# Patient Record
Sex: Male | Born: 1985 | Hispanic: No | Marital: Single
Health system: Southern US, Community
[De-identification: ages and names within clinical notes are randomized; demographics above are authoritative.]

## PROBLEM LIST (undated history)

## (undated) DIAGNOSIS — G5631 Lesion of radial nerve, right upper limb: Secondary | ICD-10-CM

---

## 2017-07-05 ENCOUNTER — Encounter (HOSPITAL_COMMUNITY): Payer: Self-pay

## 2017-07-05 ENCOUNTER — Ambulatory Visit (HOSPITAL_COMMUNITY)
Admission: EM | Admit: 2017-07-05 | Discharge: 2017-07-05 | Disposition: A | Discharge status: Home / Self Care | Payer: BLUE CROSS/BLUE SHIELD | Attending: Internal Medicine | Admitting: Internal Medicine

## 2017-07-05 DIAGNOSIS — W260XXA Contact with knife, initial encounter: Secondary | ICD-10-CM | POA: Diagnosis not present

## 2017-07-05 DIAGNOSIS — S61412A Laceration without foreign body of left hand, initial encounter: Secondary | ICD-10-CM

## 2017-07-05 DIAGNOSIS — M79642 Pain in left hand: Secondary | ICD-10-CM

## 2017-07-05 DIAGNOSIS — L03114 Cellulitis of left upper limb: Secondary | ICD-10-CM

## 2017-07-05 DIAGNOSIS — Z23 Encounter for immunization: Secondary | ICD-10-CM | POA: Diagnosis not present

## 2017-07-05 MED ORDER — CEFTRIAXONE SODIUM 1 G IJ SOLR
1.0000 g | Freq: Once | INTRAMUSCULAR | Status: AC
  Administered 2017-07-05: 1 g via INTRAMUSCULAR

## 2017-07-05 MED ORDER — TETANUS-DIPHTH-ACELL PERTUSSIS 5-2.5-18.5 LF-MCG/0.5 IM SUSP
0.5000 mL | Freq: Once | INTRAMUSCULAR | Status: AC
  Administered 2017-07-05: 0.5 mL via INTRAMUSCULAR

## 2017-07-05 MED ORDER — MUPIROCIN 2 % EX OINT: 1.0000 "application " | TOPICAL_OINTMENT | Freq: Two times a day (BID) | CUTANEOUS | 0 refills | Status: AC

## 2017-07-05 MED ORDER — SULFAMETHOXAZOLE-TRIMETHOPRIM 800-160 MG PO TABS: 1.0000 | ORAL_TABLET | Freq: Two times a day (BID) | ORAL | 0 refills | Status: AC

## 2017-07-05 MED ORDER — CEPHALEXIN 500 MG PO CAPS: 500.0000 mg | ORAL_CAPSULE | Freq: Two times a day (BID) | ORAL | 0 refills | Status: DC

## 2017-07-05 MED ORDER — TETANUS-DIPHTH-ACELL PERTUSSIS 5-2.5-18.5 LF-MCG/0.5 IM SUSP
INTRAMUSCULAR | Status: AC
  Filled 2017-07-05: qty 0.5

## 2017-07-05 MED ORDER — CEFTRIAXONE SODIUM 1 G IJ SOLR
INTRAMUSCULAR | Status: AC
  Filled 2017-07-05: qty 10

## 2017-07-05 MED ORDER — LIDOCAINE HCL (PF) 2 % IJ SOLN
INTRAMUSCULAR | Status: AC
  Filled 2017-07-05: qty 2

## 2017-07-05 NOTE — ED Provider Notes (Signed)
[facility]    CSN: [id number] Arrival date & time: 07/05/17  1944     History   Chief Complaint Chief Complaint  Patient presents with  . Hand Pain    HPI Edward Wall is a 31 y.o. male. He was cutting a box a couple of days ago, and accidentally cut himself over the left second MCP. It has been sore, but today became suddenly red/swollen/very painful. Seeping a scant amount of serous material.    HPI  History reviewed. No pertinent past medical history.  History reviewed. No pertinent surgical history.     Home Medications    Prior to Admission medications   Medication Sig Start Date End Date Taking? Authorizing Provider  cephALEXin (KEFLEX) 500 MG capsule Take 1 capsule (500 mg total) by mouth 2 (two) times daily. 07/05/17   Eustace Moore, MD  mupirocin ointment (BACTROBAN) 2 % Apply 1 application topically 2 (two) times daily. 07/05/17 07/12/17  Eustace Moore, MD  sulfamethoxazole-trimethoprim (BACTRIM DS,SEPTRA DS) 800-160 MG tablet Take 1 tablet by mouth 2 (two) times daily. 07/05/17 07/15/17  Eustace Moore, MD    Family History History reviewed. No pertinent family history.  Social History Social History  Substance Use Topics  . Smoking status: Current Every Day Smoker  . Smokeless tobacco: Never Used  . Alcohol use Yes     Allergies   Patient has no known allergies.   Review of Systems Review of Systems  All other systems reviewed and are negative.    Physical Exam Triage Vital Signs ED Triage Vitals  Enc Vitals Group     BP 07/05/17 1958 132/88     Pulse Rate 07/05/17 1958 (!) 108     Resp 07/05/17 1958 16     Temp 07/05/17 1958 98.6 F (37 C)     Temp Source 07/05/17 1958 Oral     SpO2 07/05/17 1958 100 %     Weight --      Height --      Pain Score 07/05/17 2000 6     Pain Loc --    Updated Vital Signs BP 132/88 (BP Location: Left Arm)   Pulse (!) 108   Temp 98.6 F (37 C) (Oral)   Resp 16   SpO2 100%    Physical Exam  Constitutional: He is oriented to person, place, and time. No distress.  Alert, nicely groomed  HENT:  Head: Atraumatic.  Eyes:  Conjugate gaze, no eye redness/drainage  Neck: Neck supple.  Cardiovascular: Normal rate.   Pulmonary/Chest: No respiratory distress.  Abdominal: He exhibits no distension.  Musculoskeletal: Normal range of motion.  Neurological: He is alert and oriented to person, place, and time.  Skin: Skin is warm and dry.  No cyanosis Significant redness/swelling around the left second MCP dorsally extending to the back of the hand and slightly to the wrist., with a 1 cm laceration overlying the MCP. He is able to move the finger, but very painful to do so. Not indurated, no focal fluid collection/fluctuance appreciated on exam. Not able to express any drainage. Very tender.  Nursing note and vitals reviewed.    UC Treatments / Results   Procedures Procedures (including critical care time)  Medications Ordered in UC Medications  Tdap (BOOSTRIX) injection 0.5 mL (0.5 mLs Intramuscular Given 07/05/17 2038)  cefTRIAXone (ROCEPHIN) injection 1 g (1 g Intramuscular Given 07/05/17 2038)     Final Clinical Impressions(s) / UC Diagnoses   Final diagnoses:  Cellulitis of left hand   Recheck left hand infection at urgent care tomorrow, 9/16.  Too soon tonight to rule out the possibility of a deeper abscess that would need drainage. Go to ED if marked increase in pain/swelling overnight. Injection of rocephin (antibiotic) and tetanus booster given at the urgent care this evening.  Prescriptions for trimethoprim/sulfa (bactrim, antibiotic for MRSA/staph) and cephalexin (keflex, antibiotic for strep) were sent to the pharmacy.  Start trimethoprim/sulfa tonight and cephalexin in the morning, they are both twice daily antibiotics.  Wash gently with soap/water 1-2 times daily, apply antibiotic ointment and bandage.    New Prescriptions New Prescriptions    CEPHALEXIN (KEFLEX) 500 MG CAPSULE    Take 1 capsule (500 mg total) by mouth 2 (two) times daily.   MUPIROCIN OINTMENT (BACTROBAN) 2 %    Apply 1 application topically 2 (two) times daily.   SULFAMETHOXAZOLE-TRIMETHOPRIM (BACTRIM DS,SEPTRA DS) 800-160 MG TABLET    Take 1 tablet by mouth 2 (two) times daily.     Controlled Substance Prescriptions [place] Controlled Substance Registry consulted? no   Eustace Moore, MD 07/06/17 1255

## 2017-07-05 NOTE — Discharge Instructions (Addendum)
Recheck left hand infection at urgent care tomorrow, 9/16.  Too soon tonight to rule out the possibility of a deeper abscess that would need drainage. Go to ED if marked increase in pain/swelling overnight. Injection of rocephin (antibiotic) and tetanus booster given at the urgent care this evening.  Prescriptions for trimethoprim/sulfa (bactrim, antibiotic for MRSA/staph) and cephalexin (keflex, antibiotic for strep) were sent to the pharmacy.  Start trimethoprim/sulfa tonight and cephalexin in the morning, they are both twice daily antibiotics.  Wash gently with soap/water 1-2 times daily, apply antibiotic ointment and bandage.

## 2017-07-05 NOTE — ED Triage Notes (Signed)
Pt presents today with left hand pain that occurred 2 days ago when he was trying to cut open a box and the knife used cut his hand. States the pain has gotten worse over the last couple day. Has increased swelling and redness.

## 2017-07-06 ENCOUNTER — Encounter (HOSPITAL_COMMUNITY): Payer: Self-pay | Admitting: Family Medicine

## 2017-07-06 ENCOUNTER — Ambulatory Visit (HOSPITAL_COMMUNITY)
Admission: EM | Admit: 2017-07-06 | Discharge: 2017-07-06 | Disposition: A | Discharge status: Home / Self Care | Payer: BLUE CROSS/BLUE SHIELD | Attending: Family Medicine | Admitting: Family Medicine

## 2017-07-06 DIAGNOSIS — L03012 Cellulitis of left finger: Secondary | ICD-10-CM

## 2017-07-06 NOTE — Discharge Instructions (Signed)
Continue your antibiotics. Please return immediately if swelling starts to advance up the arm or if you become feverish. Let's recheck the wound on Wednesday.

## 2017-07-06 NOTE — ED Provider Notes (Signed)
  [facility]   [id number] 07/06/17 Arrival Time: 1949   SUBJECTIVE:  Edward Wall is a 31 y.o. male who presents to the urgent care with complaint of left hand cellulitis that occurred after cutting himself with a hunting knife while opening a box on Thursday night He was seen last night and started on antibiotics.  He was instructed to return today. He worked today.  Swelling and soreness have not changed. No fever.     History reviewed. No pertinent past medical history. History reviewed. No pertinent family history. Social History   Social History  . Marital status: Unknown    Spouse name: N/A  . Number of children: N/A  . Years of education: N/A   Occupational History  . Not on file.   Social History Main Topics  . Smoking status: Current Every Day Smoker  . Smokeless tobacco: Never Used  . Alcohol use Yes  . Drug use: No  . Sexual activity: Not on file   Other Topics Concern  . Not on file   Social History Narrative  . No narrative on file   No outpatient prescriptions have been marked as taking for the 07/06/17 encounter [facility] Encounter).   No Known Allergies    ROS: As per HPI, remainder of ROS negative.   OBJECTIVE:   Vitals:   07/06/17 2002  BP: 112/77  Pulse: 77  Resp: 18  Temp: 98.7 F (37.1 C)  SpO2: 100%     General appearance: alert; no distress Eyes: PERRL; EOMI; conjunctiva normal HENT: normocephalic; atraumatic; TMs normal, canal normal, external ears normal without trauma; nasal mucosa normal; oral mucosa normal Neck: supple Back: no CVA tenderness Extremities: no cyanosis  Skin: warm and dry, with moderate erythema over radial aspect of left hand including the MCP of index finger where the original wound occurred.  No red streaks or swelling over wrist or forearm. Neurologic: normal gait; grossly normal Psychological: alert and cooperative; normal mood and affect       ASSESSMENT & PLAN:  1. Cellulitis  of finger of left hand    Continue current meds  Reviewed expectations re: course of current medical issues. Questions answered. Outlined signs and symptoms indicating need for more acute intervention. Patient verbalized understanding. After Visit Summary given.    Procedures:      Elvina Sidle, MD 07/06/17 2011

## 2017-07-06 NOTE — ED Triage Notes (Signed)
Pt here for wound check to left hand.

## 2019-07-16 ENCOUNTER — Encounter (HOSPITAL_COMMUNITY): Payer: Self-pay

## 2019-07-16 ENCOUNTER — Other Ambulatory Visit: Payer: Self-pay

## 2019-07-16 ENCOUNTER — Inpatient Hospital Stay (HOSPITAL_COMMUNITY): Payer: No Typology Code available for payment source

## 2019-07-16 ENCOUNTER — Emergency Department (HOSPITAL_COMMUNITY): Payer: No Typology Code available for payment source

## 2019-07-16 ENCOUNTER — Inpatient Hospital Stay (HOSPITAL_COMMUNITY): Payer: No Typology Code available for payment source | Admitting: Anesthesiology

## 2019-07-16 ENCOUNTER — Inpatient Hospital Stay (HOSPITAL_COMMUNITY)
Admission: EM | Admit: 2019-07-16 | Discharge: 2019-07-18 | DRG: 511 | Disposition: A | Discharge status: Home / Self Care | Payer: No Typology Code available for payment source | Attending: Surgery | Admitting: Surgery

## 2019-07-16 ENCOUNTER — Encounter (HOSPITAL_COMMUNITY): Admission: EM | Disposition: A | Discharge status: Home / Self Care | Payer: Self-pay | Source: Home / Self Care

## 2019-07-16 DIAGNOSIS — F1290 Cannabis use, unspecified, uncomplicated: Secondary | ICD-10-CM | POA: Diagnosis present

## 2019-07-16 DIAGNOSIS — W130XXA Fall from, out of or through balcony, initial encounter: Secondary | ICD-10-CM | POA: Diagnosis present

## 2019-07-16 DIAGNOSIS — Z20828 Contact with and (suspected) exposure to other viral communicable diseases: Secondary | ICD-10-CM | POA: Diagnosis present

## 2019-07-16 DIAGNOSIS — S52601A Unspecified fracture of lower end of right ulna, initial encounter for closed fracture: Secondary | ICD-10-CM | POA: Diagnosis present

## 2019-07-16 DIAGNOSIS — R52 Pain, unspecified: Secondary | ICD-10-CM | POA: Diagnosis present

## 2019-07-16 DIAGNOSIS — S52571A Other intraarticular fracture of lower end of right radius, initial encounter for closed fracture: Principal | ICD-10-CM | POA: Diagnosis present

## 2019-07-16 DIAGNOSIS — S0181XA Laceration without foreign body of other part of head, initial encounter: Secondary | ICD-10-CM | POA: Diagnosis present

## 2019-07-16 DIAGNOSIS — S36113A Laceration of liver, unspecified degree, initial encounter: Secondary | ICD-10-CM | POA: Diagnosis present

## 2019-07-16 DIAGNOSIS — S2241XA Multiple fractures of ribs, right side, initial encounter for closed fracture: Secondary | ICD-10-CM | POA: Diagnosis present

## 2019-07-16 DIAGNOSIS — S42001A Fracture of unspecified part of right clavicle, initial encounter for closed fracture: Secondary | ICD-10-CM | POA: Diagnosis present

## 2019-07-16 DIAGNOSIS — W19XXXA Unspecified fall, initial encounter: Secondary | ICD-10-CM

## 2019-07-16 DIAGNOSIS — S61419A Laceration without foreign body of unspecified hand, initial encounter: Secondary | ICD-10-CM | POA: Diagnosis present

## 2019-07-16 DIAGNOSIS — S2249XA Multiple fractures of ribs, unspecified side, initial encounter for closed fracture: Secondary | ICD-10-CM

## 2019-07-16 DIAGNOSIS — T148XXA Other injury of unspecified body region, initial encounter: Secondary | ICD-10-CM

## 2019-07-16 DIAGNOSIS — M25532 Pain in left wrist: Secondary | ICD-10-CM

## 2019-07-16 DIAGNOSIS — S52501A Unspecified fracture of the lower end of right radius, initial encounter for closed fracture: Secondary | ICD-10-CM

## 2019-07-16 DIAGNOSIS — Z419 Encounter for procedure for purposes other than remedying health state, unspecified: Secondary | ICD-10-CM

## 2019-07-16 HISTORY — PX: ORIF WRIST FRACTURE: SHX2133

## 2019-07-16 LAB — COMPREHENSIVE METABOLIC PANEL
ALT: 297 U/L — ABNORMAL HIGH (ref 0–44)
ALT: 287 U/L — ABNORMAL HIGH (ref 0–44)
AST: 1472 U/L — ABNORMAL HIGH (ref 15–41)
AST: 1689 U/L — ABNORMAL HIGH (ref 15–41)
Albumin: 3.8 g/dL (ref 3.5–5.0)
Albumin: 3.4 g/dL — ABNORMAL LOW (ref 3.5–5.0)
Alkaline Phosphatase: 87 U/L (ref 38–126)
Alkaline Phosphatase: 77 U/L (ref 38–126)
Anion gap: 16 — ABNORMAL HIGH (ref 5–15)
Anion gap: 10 (ref 5–15)
BUN: 7 mg/dL (ref 6–20)
BUN: 6 mg/dL (ref 6–20)
CO2: 20 mmol/L — ABNORMAL LOW (ref 22–32)
CO2: 23 mmol/L (ref 22–32)
Calcium: 8.9 mg/dL (ref 8.9–10.3)
Calcium: 8.5 mg/dL — ABNORMAL LOW (ref 8.9–10.3)
Chloride: 98 mmol/L (ref 98–111)
Chloride: 104 mmol/L (ref 98–111)
Creatinine, Ser: 1.09 mg/dL (ref 0.61–1.24)
Creatinine, Ser: 0.98 mg/dL (ref 0.61–1.24)
GFR calc Af Amer: >60 mL/min (ref >60)
GFR calc Af Amer: >60 mL/min (ref >60)
GFR calc non Af Amer: >60 mL/min (ref >60)
GFR calc non Af Amer: >60 mL/min (ref >60)
Glucose, Bld: 122 mg/dL — ABNORMAL HIGH (ref 70–99)
Glucose, Bld: 109 mg/dL — ABNORMAL HIGH (ref 70–99)
Potassium: 3.9 mmol/L (ref 3.5–5.1)
Potassium: 3.2 mmol/L — ABNORMAL LOW (ref 3.5–5.1)
Sodium: 134 mmol/L — ABNORMAL LOW (ref 135–145)
Sodium: 137 mmol/L (ref 135–145)
Total Bilirubin: 1.4 mg/dL — ABNORMAL HIGH (ref 0.3–1.2)
Total Bilirubin: 1 mg/dL (ref 0.3–1.2)
Total Protein: 5.6 g/dL — ABNORMAL LOW (ref 6.5–8.1)
Total Protein: 5.2 g/dL — ABNORMAL LOW (ref 6.5–8.1)

## 2019-07-16 LAB — CBC
HCT: 41.1 % (ref 39.0–52.0)
HCT: 35.7 % — ABNORMAL LOW (ref 39.0–52.0)
Hemoglobin: 14.2 g/dL (ref 13.0–17.0)
Hemoglobin: 12.2 g/dL — ABNORMAL LOW (ref 13.0–17.0)
MCH: 36 pg — ABNORMAL HIGH (ref 26.0–34.0)
MCH: 35.8 pg — ABNORMAL HIGH (ref 26.0–34.0)
MCHC: 34.5 g/dL (ref 30.0–36.0)
MCHC: 34.2 g/dL (ref 30.0–36.0)
MCV: 104.3 fL — ABNORMAL HIGH (ref 80.0–100.0)
MCV: 104.7 fL — ABNORMAL HIGH (ref 80.0–100.0)
Platelets: 161 10*3/uL (ref 150–400)
Platelets: 121 10*3/uL — ABNORMAL LOW (ref 150–400)
RBC: 3.94 MIL/uL — ABNORMAL LOW (ref 4.22–5.81)
RBC: 3.41 MIL/uL — ABNORMAL LOW (ref 4.22–5.81)
RDW: 14.3 % (ref 11.5–15.5)
RDW: 14.3 % (ref 11.5–15.5)
WBC: 8.5 10*3/uL (ref 4.0–10.5)
WBC: 7.9 10*3/uL (ref 4.0–10.5)
nRBC: 0.7 % — ABNORMAL HIGH (ref 0.0–0.2)
nRBC: 0 % (ref 0.0–0.2)

## 2019-07-16 LAB — I-STAT CHEM 8, ED
BUN: 7 mg/dL (ref 6–20)
Calcium, Ion: 1.11 mmol/L — ABNORMAL LOW (ref 1.15–1.40)
Chloride: 94 mmol/L — ABNORMAL LOW (ref 98–111)
Creatinine, Ser: 1.3 mg/dL — ABNORMAL HIGH (ref 0.61–1.24)
Glucose, Bld: 111 mg/dL — ABNORMAL HIGH (ref 70–99)
HCT: 37 % — ABNORMAL LOW (ref 39.0–52.0)
Hemoglobin: 12.6 g/dL — ABNORMAL LOW (ref 13.0–17.0)
Potassium: 3.3 mmol/L — ABNORMAL LOW (ref 3.5–5.1)
Sodium: 134 mmol/L — ABNORMAL LOW (ref 135–145)
TCO2: 24 mmol/L (ref 22–32)

## 2019-07-16 LAB — TYPE AND SCREEN
ABO/RH(D): O POS
Antibody Screen: NEGATIVE

## 2019-07-16 LAB — PROTIME-INR
INR: 1.2 (ref 0.8–1.2)
Prothrombin Time: 14.7 seconds (ref 11.4–15.2)

## 2019-07-16 LAB — MRSA PCR SCREENING: MRSA by PCR: NEGATIVE

## 2019-07-16 LAB — PHOSPHORUS: Phosphorus: 2.8 mg/dL (ref 2.5–4.6)

## 2019-07-16 LAB — HIV ANTIBODY (ROUTINE TESTING W REFLEX): HIV Screen 4th Generation wRfx: NONREACTIVE

## 2019-07-16 LAB — SARS CORONAVIRUS 2 BY RT PCR (HOSPITAL ORDER, PERFORMED IN ~~LOC~~ HOSPITAL LAB): SARS Coronavirus 2: NEGATIVE

## 2019-07-16 LAB — ETHANOL: Alcohol, Ethyl (B): 229 mg/dL — ABNORMAL HIGH (ref <10)

## 2019-07-16 LAB — CDS SEROLOGY

## 2019-07-16 LAB — LACTIC ACID, PLASMA: Lactic Acid, Venous: 5.4 mmol/L (ref 0.5–1.9)

## 2019-07-16 LAB — MAGNESIUM: Magnesium: 1.4 mg/dL — ABNORMAL LOW (ref 1.7–2.4)

## 2019-07-16 LAB — ABO/RH: ABO/RH(D): O POS

## 2019-07-16 SURGERY — OPEN REDUCTION INTERNAL FIXATION (ORIF) WRIST FRACTURE
Anesthesia: General | Site: Wrist | Laterality: Right

## 2019-07-16 MED ORDER — MEPERIDINE HCL 25 MG/ML IJ SOLN: 6.2500 mg | INTRAMUSCULAR | Status: DC | PRN

## 2019-07-16 MED ORDER — LIDOCAINE 2% (20 MG/ML) 5 ML SYRINGE
INTRAMUSCULAR | Status: DC | PRN
  Administered 2019-07-16: 100 mg via INTRAVENOUS

## 2019-07-16 MED ORDER — BACITRACIN ZINC 500 UNIT/GM EX OINT
TOPICAL_OINTMENT | Freq: Two times a day (BID) | CUTANEOUS | Status: DC
  Administered 2019-07-17: 22:00:00 via TOPICAL
  Filled 2019-07-16: qty 28.4

## 2019-07-16 MED ORDER — 0.9 % SODIUM CHLORIDE (POUR BTL) OPTIME
TOPICAL | Status: DC | PRN
  Administered 2019-07-16: 1000 mL

## 2019-07-16 MED ORDER — VANCOMYCIN HCL POWD
Status: DC | PRN
  Administered 2019-07-16: 11:00:00 1000 mg

## 2019-07-16 MED ORDER — SODIUM CHLORIDE 0.9 % IV SOLN
INTRAVENOUS | Status: DC | PRN
  Administered 2019-07-16: 10:00:00 25 ug/min via INTRAVENOUS

## 2019-07-16 MED ORDER — ADULT MULTIVITAMIN W/MINERALS CH
1.0000 | ORAL_TABLET | Freq: Every day | ORAL | Status: DC
  Administered 2019-07-17 – 2019-07-18 (×2): 1 via ORAL
  Filled 2019-07-16 (×2): qty 1

## 2019-07-16 MED ORDER — PROPOFOL 10 MG/ML IV BOLUS
INTRAVENOUS | Status: DC | PRN
  Administered 2019-07-16: 100 mg via INTRAVENOUS

## 2019-07-16 MED ORDER — FENTANYL CITRATE (PF) 100 MCG/2ML IJ SOLN
INTRAMUSCULAR | Status: AC
  Administered 2019-07-16: 09:00:00 50 ug via INTRAVENOUS
  Filled 2019-07-16: qty 2

## 2019-07-16 MED ORDER — FOLIC ACID 1 MG PO TABS
1.0000 mg | ORAL_TABLET | Freq: Every day | ORAL | Status: DC
  Administered 2019-07-17 – 2019-07-18 (×2): 1 mg via ORAL
  Filled 2019-07-16 (×2): qty 1

## 2019-07-16 MED ORDER — HYDROMORPHONE HCL 1 MG/ML IJ SOLN
0.5000 mg | INTRAMUSCULAR | Status: DC | PRN
  Administered 2019-07-17: 0.5 mg via INTRAVENOUS
  Filled 2019-07-16: qty 1

## 2019-07-16 MED ORDER — LORAZEPAM 2 MG/ML IJ SOLN
1.0000 mg | INTRAMUSCULAR | Status: DC | PRN
  Administered 2019-07-18: 2 mg via INTRAVENOUS
  Filled 2019-07-16: qty 1

## 2019-07-16 MED ORDER — IOHEXOL 300 MG/ML  SOLN
100.0000 mL | Freq: Once | INTRAMUSCULAR | Status: AC | PRN
  Administered 2019-07-16: 03:00:00 100 mL via INTRAVENOUS

## 2019-07-16 MED ORDER — LORAZEPAM 1 MG PO TABS: 1.0000 mg | ORAL_TABLET | ORAL | Status: DC | PRN

## 2019-07-16 MED ORDER — FENTANYL CITRATE (PF) 250 MCG/5ML IJ SOLN
INTRAMUSCULAR | Status: AC
  Filled 2019-07-16: qty 5

## 2019-07-16 MED ORDER — ROPIVACAINE HCL 7.5 MG/ML IJ SOLN
INTRAMUSCULAR | Status: DC | PRN
  Administered 2019-07-16 (×4): 5 mL via PERINEURAL

## 2019-07-16 MED ORDER — FENTANYL CITRATE (PF) 250 MCG/5ML IJ SOLN
INTRAMUSCULAR | Status: DC | PRN
  Administered 2019-07-16: 100 ug via INTRAVENOUS

## 2019-07-16 MED ORDER — LACTATED RINGERS IV SOLN
INTRAVENOUS | Status: DC
  Administered 2019-07-16 (×4): via INTRAVENOUS

## 2019-07-16 MED ORDER — HYDRALAZINE HCL 10 MG PO TABS: 10.0000 mg | ORAL_TABLET | Freq: Four times a day (QID) | ORAL | Status: DC | PRN

## 2019-07-16 MED ORDER — ACETAMINOPHEN 500 MG PO TABS
1000.0000 mg | ORAL_TABLET | Freq: Four times a day (QID) | ORAL | Status: DC
  Administered 2019-07-16 – 2019-07-18 (×6): 1000 mg via ORAL
  Filled 2019-07-16 (×8): qty 2

## 2019-07-16 MED ORDER — KETOROLAC TROMETHAMINE 30 MG/ML IJ SOLN: 30.0000 mg | Freq: Once | INTRAMUSCULAR | Status: DC | PRN

## 2019-07-16 MED ORDER — CEFAZOLIN SODIUM-DEXTROSE 2-4 GM/100ML-% IV SOLN
2.0000 g | Freq: Three times a day (TID) | INTRAVENOUS | Status: AC
  Administered 2019-07-16 – 2019-07-17 (×3): 2 g via INTRAVENOUS
  Filled 2019-07-16 (×3): qty 100

## 2019-07-16 MED ORDER — ONDANSETRON HCL 4 MG/2ML IJ SOLN
4.0000 mg | Freq: Four times a day (QID) | INTRAMUSCULAR | Status: DC | PRN
  Administered 2019-07-16 – 2019-07-17 (×2): 4 mg via INTRAVENOUS
  Filled 2019-07-16: qty 2

## 2019-07-16 MED ORDER — CLONIDINE HCL (ANALGESIA) 100 MCG/ML EP SOLN
EPIDURAL | Status: DC | PRN
  Administered 2019-07-16: 50 ug

## 2019-07-16 MED ORDER — PROPOFOL 10 MG/ML IV BOLUS
INTRAVENOUS | Status: AC
  Filled 2019-07-16: qty 20

## 2019-07-16 MED ORDER — FENTANYL CITRATE (PF) 100 MCG/2ML IJ SOLN
50.0000 ug | Freq: Once | INTRAMUSCULAR | Status: AC
  Administered 2019-07-16: 09:00:00 50 ug via INTRAVENOUS

## 2019-07-16 MED ORDER — FENTANYL CITRATE (PF) 100 MCG/2ML IJ SOLN
INTRAMUSCULAR | Status: AC
  Filled 2019-07-16: qty 2

## 2019-07-16 MED ORDER — FENTANYL CITRATE (PF) 100 MCG/2ML IJ SOLN
50.0000 ug | Freq: Once | INTRAMUSCULAR | Status: AC
  Administered 2019-07-16: 50 ug via INTRAVENOUS

## 2019-07-16 MED ORDER — DOCUSATE SODIUM 100 MG PO CAPS
200.0000 mg | ORAL_CAPSULE | Freq: Two times a day (BID) | ORAL | Status: DC
  Administered 2019-07-16 – 2019-07-18 (×4): 200 mg via ORAL
  Filled 2019-07-16 (×4): qty 2

## 2019-07-16 MED ORDER — LIDOCAINE-EPINEPHRINE (PF) 2 %-1:200000 IJ SOLN
20.0000 mL | Freq: Once | INTRAMUSCULAR | Status: AC
  Administered 2019-07-16: 20 mL
  Filled 2019-07-16: qty 20

## 2019-07-16 MED ORDER — THIAMINE HCL 100 MG/ML IJ SOLN: 100.0000 mg | Freq: Every day | INTRAMUSCULAR | Status: DC

## 2019-07-16 MED ORDER — OXYCODONE HCL 5 MG PO TABS
5.0000 mg | ORAL_TABLET | Freq: Four times a day (QID) | ORAL | Status: DC | PRN
  Administered 2019-07-16 – 2019-07-17 (×3): 10 mg via ORAL
  Filled 2019-07-16 (×3): qty 2

## 2019-07-16 MED ORDER — MIDAZOLAM HCL 2 MG/2ML IJ SOLN
INTRAMUSCULAR | Status: AC
  Administered 2019-07-16: 09:00:00 1 mg via INTRAVENOUS
  Filled 2019-07-16: qty 2

## 2019-07-16 MED ORDER — VANCOMYCIN HCL 1000 MG IV SOLR
INTRAVENOUS | Status: AC
  Filled 2019-07-16: qty 1000

## 2019-07-16 MED ORDER — DEXAMETHASONE SODIUM PHOSPHATE 10 MG/ML IJ SOLN
INTRAMUSCULAR | Status: DC | PRN
  Administered 2019-07-16: 10 mg via INTRAVENOUS

## 2019-07-16 MED ORDER — MIDAZOLAM HCL 2 MG/2ML IJ SOLN
INTRAMUSCULAR | Status: AC
  Filled 2019-07-16: qty 2

## 2019-07-16 MED ORDER — CLONIDINE HCL (ANALGESIA) 100 MCG/ML EP SOLN: EPIDURAL | Status: DC | PRN

## 2019-07-16 MED ORDER — MIDAZOLAM HCL 2 MG/2ML IJ SOLN
1.0000 mg | Freq: Once | INTRAMUSCULAR | Status: AC
  Administered 2019-07-16: 09:00:00 1 mg via INTRAVENOUS

## 2019-07-16 MED ORDER — METHOCARBAMOL 500 MG PO TABS
500.0000 mg | ORAL_TABLET | Freq: Four times a day (QID) | ORAL | Status: DC | PRN
  Administered 2019-07-16 – 2019-07-18 (×4): 500 mg via ORAL
  Filled 2019-07-16 (×4): qty 1

## 2019-07-16 MED ORDER — CEFAZOLIN SODIUM-DEXTROSE 2-3 GM-%(50ML) IV SOLR
INTRAVENOUS | Status: DC | PRN
  Administered 2019-07-16: 2 g via INTRAVENOUS

## 2019-07-16 MED ORDER — GABAPENTIN 100 MG PO CAPS
100.0000 mg | ORAL_CAPSULE | Freq: Three times a day (TID) | ORAL | Status: DC
  Administered 2019-07-16 – 2019-07-18 (×6): 100 mg via ORAL
  Filled 2019-07-16 (×6): qty 1

## 2019-07-16 MED ORDER — HYDROMORPHONE HCL 1 MG/ML IJ SOLN: 0.2500 mg | INTRAMUSCULAR | Status: DC | PRN

## 2019-07-16 MED ORDER — ONDANSETRON 4 MG PO TBDP: 4.0000 mg | ORAL_TABLET | Freq: Four times a day (QID) | ORAL | Status: DC | PRN

## 2019-07-16 MED ORDER — TETANUS-DIPHTH-ACELL PERTUSSIS 5-2.5-18.5 LF-MCG/0.5 IM SUSP
0.5000 mL | Freq: Once | INTRAMUSCULAR | Status: AC
  Administered 2019-07-16: 04:00:00 0.5 mL via INTRAMUSCULAR
  Filled 2019-07-16: qty 0.5

## 2019-07-16 MED ORDER — VITAMIN B-1 100 MG PO TABS
100.0000 mg | ORAL_TABLET | Freq: Every day | ORAL | Status: DC
  Administered 2019-07-17 – 2019-07-18 (×2): 100 mg via ORAL
  Filled 2019-07-16 (×2): qty 1

## 2019-07-16 MED ORDER — MIDAZOLAM HCL 2 MG/2ML IJ SOLN
INTRAMUSCULAR | Status: DC | PRN
  Administered 2019-07-16: 1 mg via INTRAVENOUS

## 2019-07-16 MED ORDER — PHENYLEPHRINE HCL (PRESSORS) 10 MG/ML IV SOLN
INTRAVENOUS | Status: DC | PRN
  Administered 2019-07-16 (×2): 80 ug via INTRAVENOUS

## 2019-07-16 MED ORDER — PROMETHAZINE HCL 25 MG/ML IJ SOLN: 6.2500 mg | INTRAMUSCULAR | Status: DC | PRN

## 2019-07-16 MED FILL — Vancomycin HCl For IV Soln 1 GM (Base Equivalent): INTRAVENOUS | Qty: 1000 | Status: AC

## 2019-07-16 SURGICAL SUPPLY — 112 items
BIT DRILL 2.2 SS TIBIAL (BIT) ×2 IMPLANT
BLADE CLIPPER SURG (BLADE) IMPLANT
BNDG COHESIVE 4X5 TAN STRL (GAUZE/BANDAGES/DRESSINGS) IMPLANT
BNDG ELASTIC 3X5.8 VLCR STR LF (GAUZE/BANDAGES/DRESSINGS) ×4 IMPLANT
BNDG ELASTIC 4X5.8 VLCR STR LF (GAUZE/BANDAGES/DRESSINGS) ×4 IMPLANT
BNDG ESMARK 4X9 LF (GAUZE/BANDAGES/DRESSINGS) ×2 IMPLANT
BNDG GAUZE ELAST 4 BULKY (GAUZE/BANDAGES/DRESSINGS) ×4 IMPLANT
BRUSH SCRUB EZ PLAIN DRY (MISCELLANEOUS) ×8 IMPLANT
CANISTER SUCT 3000ML PPV (MISCELLANEOUS) ×4 IMPLANT
CHLORAPREP W/TINT 26 (MISCELLANEOUS) ×4 IMPLANT
CLOSURE STERI-STRIP 1/2X4 (GAUZE/BANDAGES/DRESSINGS) ×1
CLSR STERI-STRIP ANTIMIC 1/2X4 (GAUZE/BANDAGES/DRESSINGS) ×1 IMPLANT
CORD BIPOLAR FORCEPS 12FT (ELECTRODE) ×6 IMPLANT
COVER SURGICAL LIGHT HANDLE (MISCELLANEOUS) ×8 IMPLANT
COVER WAND RF STERILE (DRAPES) ×4 IMPLANT
CUFF TOURN SGL QUICK 18X4 (TOURNIQUET CUFF) ×4 IMPLANT
CUFF TOURN SGL QUICK 24 (TOURNIQUET CUFF)
CUFF TRNQT CYL 24X4X16.5-23 (TOURNIQUET CUFF) IMPLANT
DERMABOND ADVANCED (GAUZE/BANDAGES/DRESSINGS) ×4
DERMABOND ADVANCED .7 DNX12 (GAUZE/BANDAGES/DRESSINGS) ×4 IMPLANT
DRAIN TLS ROUND 10FR (DRAIN) IMPLANT
DRAPE C-ARM 42X72 X-RAY (DRAPES) ×4 IMPLANT
DRAPE INCISE IOBAN 66X45 STRL (DRAPES) ×6 IMPLANT
DRAPE OEC MINIVIEW 54X84 (DRAPES) ×4 IMPLANT
DRAPE ORTHO SPLIT 77X108 STRL (DRAPES) ×4
DRAPE SURG 17X23 STRL (DRAPES) ×4 IMPLANT
DRAPE SURG ORHT 6 SPLT 77X108 (DRAPES) ×4 IMPLANT
DRAPE U-SHAPE 47X51 STRL (DRAPES) ×8 IMPLANT
DRSG MEPILEX BORDER 4X8 (GAUZE/BANDAGES/DRESSINGS) ×4 IMPLANT
ELECT REM PT RETURN 9FT ADLT (ELECTROSURGICAL) ×4
ELECTRODE REM PT RTRN 9FT ADLT (ELECTROSURGICAL) ×2 IMPLANT
GAUZE SPONGE 4X4 12PLY STRL (GAUZE/BANDAGES/DRESSINGS) ×6 IMPLANT
GAUZE SPONGE 4X4 12PLY STRL LF (GAUZE/BANDAGES/DRESSINGS) ×2 IMPLANT
GAUZE XEROFORM 1X8 LF (GAUZE/BANDAGES/DRESSINGS) ×4 IMPLANT
GLOVE BIO SURGEON STRL SZ 6.5 (GLOVE) ×9 IMPLANT
GLOVE BIO SURGEON STRL SZ7.5 (GLOVE) ×12 IMPLANT
GLOVE BIO SURGEONS STRL SZ 6.5 (GLOVE) ×3
GLOVE BIOGEL PI IND STRL 6.5 (GLOVE) ×2 IMPLANT
GLOVE BIOGEL PI IND STRL 7.5 (GLOVE) ×2 IMPLANT
GLOVE BIOGEL PI INDICATOR 6.5 (GLOVE) ×2
GLOVE BIOGEL PI INDICATOR 7.5 (GLOVE) ×2
GLOVE SS BIOGEL STRL SZ 8 (GLOVE) ×2 IMPLANT
GLOVE SUPERSENSE BIOGEL SZ 8 (GLOVE) ×2
GOWN STRL REUS W/ TWL LRG LVL3 (GOWN DISPOSABLE) ×4 IMPLANT
GOWN STRL REUS W/ TWL XL LVL3 (GOWN DISPOSABLE) ×2 IMPLANT
GOWN STRL REUS W/TWL LRG LVL3 (GOWN DISPOSABLE) ×4
GOWN STRL REUS W/TWL XL LVL3 (GOWN DISPOSABLE) ×2
K-WIRE 1.6 (WIRE) ×6
K-WIRE FX5X1.6XNS BN SS (WIRE) ×6
KIT BASIN OR (CUSTOM PROCEDURE TRAY) ×4 IMPLANT
KIT TURNOVER KIT B (KITS) ×4 IMPLANT
KWIRE FX5X1.6XNS BN SS (WIRE) IMPLANT
LOOP VESSEL MAXI BLUE (MISCELLANEOUS) IMPLANT
MANIFOLD NEPTUNE II (INSTRUMENTS) ×4 IMPLANT
NDL HYPO 25GX1X1/2 BEV (NEEDLE) IMPLANT
NEEDLE 22X1 1/2 (OR ONLY) (NEEDLE) IMPLANT
NEEDLE HYPO 25GX1X1/2 BEV (NEEDLE) IMPLANT
NS IRRIG 1000ML POUR BTL (IV SOLUTION) ×4 IMPLANT
PACK GENERAL/GYN (CUSTOM PROCEDURE TRAY) ×4 IMPLANT
PACK ORTHO EXTREMITY (CUSTOM PROCEDURE TRAY) ×4 IMPLANT
PAD ARMBOARD 7.5X6 YLW CONV (MISCELLANEOUS) ×8 IMPLANT
PAD CAST 3X4 CTTN HI CHSV (CAST SUPPLIES) ×2 IMPLANT
PAD CAST 4YDX4 CTTN HI CHSV (CAST SUPPLIES) ×2 IMPLANT
PADDING CAST ABS 3INX4YD NS (CAST SUPPLIES) ×2
PADDING CAST ABS 4INX4YD NS (CAST SUPPLIES) ×2
PADDING CAST ABS COTTON 3X4 (CAST SUPPLIES) IMPLANT
PADDING CAST ABS COTTON 4X4 ST (CAST SUPPLIES) IMPLANT
PADDING CAST COTTON 3X4 STRL (CAST SUPPLIES) ×2
PADDING CAST COTTON 4X4 STRL (CAST SUPPLIES) ×2
PLATE DVR CROSSLOCK STD RT (Plate) ×2 IMPLANT
SCREW  LP NL 2.7X16MM (Screw) ×2 IMPLANT
SCREW 2.7X14MM (Screw) ×4 IMPLANT
SCREW BN 14X2.7XNONLOCK 3 LD (Screw) IMPLANT
SCREW LOCK 22X2.7X 3 LD TPR (Screw) IMPLANT
SCREW LOCK 24X2.7X3 LD THRD (Screw) IMPLANT
SCREW LOCK 26X2.7X 3 LD TPR (Screw) IMPLANT
SCREW LOCKING 2.7X22MM (Screw) ×2 IMPLANT
SCREW LOCKING 2.7X24MM (Screw) ×6 IMPLANT
SCREW LOCKING 2.7X26MM (Screw) ×4 IMPLANT
SCREW LP NL 2.7X16MM (Screw) IMPLANT
SCREW NONLOCK 2.7X18MM (Screw) ×2 IMPLANT
SLING ARM IMMOBILIZER LRG (SOFTGOODS) IMPLANT
SLING ARM IMMOBILIZER MED (SOFTGOODS) IMPLANT
SOL PREP POV-IOD 4OZ 10% (MISCELLANEOUS) ×8 IMPLANT
SPLINT PLASTER CAST XFAST 5X30 (CAST SUPPLIES) IMPLANT
SPLINT PLASTER XFAST SET 5X30 (CAST SUPPLIES) ×2
SPONGE LAP 4X18 RFD (DISPOSABLE) IMPLANT
STAPLER VISISTAT 35W (STAPLE) ×4 IMPLANT
STOCKINETTE IMPERVIOUS 9X36 MD (GAUZE/BANDAGES/DRESSINGS) IMPLANT
SUCTION FRAZIER HANDLE 10FR (MISCELLANEOUS) ×2
SUCTION TUBE FRAZIER 10FR DISP (MISCELLANEOUS) ×2 IMPLANT
SUT ETHILON 3 0 PS 1 (SUTURE) ×2 IMPLANT
SUT MNCRL AB 3-0 PS2 18 (SUTURE) ×2 IMPLANT
SUT MNCRL AB 3-0 PS2 27 (SUTURE) ×4 IMPLANT
SUT MNCRL AB 4-0 PS2 18 (SUTURE) ×4 IMPLANT
SUT MON AB 2-0 CT1 36 (SUTURE) ×2 IMPLANT
SUT PROLENE 3 0 PS 2 (SUTURE) IMPLANT
SUT VIC AB 0 CT1 27 (SUTURE) ×2
SUT VIC AB 0 CT1 27XBRD ANBCTR (SUTURE) ×2 IMPLANT
SUT VIC AB 2-0 CT1 27 (SUTURE) ×2
SUT VIC AB 2-0 CT1 TAPERPNT 27 (SUTURE) ×2 IMPLANT
SUT VIC AB 3-0 FS2 27 (SUTURE) IMPLANT
SYR CONTROL 10ML LL (SYRINGE) IMPLANT
SYSTEM CHEST DRAIN TLS 7FR (DRAIN) IMPLANT
TAPE STRIPS DRAPE STRL (GAUZE/BANDAGES/DRESSINGS) ×2 IMPLANT
TOWEL GREEN STERILE (TOWEL DISPOSABLE) ×4 IMPLANT
TOWEL GREEN STERILE FF (TOWEL DISPOSABLE) ×4 IMPLANT
TUBE CONNECTING 12'X1/4 (SUCTIONS) ×1
TUBE CONNECTING 12X1/4 (SUCTIONS) ×3 IMPLANT
TUBE EVACUATION TLS (MISCELLANEOUS) ×4 IMPLANT
UNDERPAD 30X30 (UNDERPADS AND DIAPERS) ×4 IMPLANT
WATER STERILE IRR 1000ML POUR (IV SOLUTION) ×4 IMPLANT
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
## 2019-07-16 NOTE — Transfer of Care (Signed)
Immediate Anesthesia Transfer of Care Note  Patient: Edward Wall  Procedure(s) Performed: OPEN REDUCTION INTERNAL FIXATION (ORIF) WRIST FRACTURE (Right Wrist)  Patient Location: PACU  Anesthesia Type:General and Regional  Level of Consciousness: sedated  Airway & Oxygen Therapy: Patient Spontanous Breathing and Patient connected to nasal cannula oxygen  Post-op Assessment: Report given to RN and Post -op Vital signs reviewed and stable  Post vital signs: Reviewed and stable  Last Vitals:  Vitals Value Taken Time  BP    Temp    Pulse 72 07/16/19 1140  Resp 14 07/16/19 1140  SpO2 100 % 07/16/19 1140  Vitals shown include unvalidated device data.  Last Pain:  Vitals:   07/16/19 0251  TempSrc:   PainSc: 5          Complications: No apparent anesthesia complications

## 2019-07-16 NOTE — Anesthesia Procedure Notes (Signed)
Procedure Name: LMA Insertion Date/Time: 07/16/2019 10:00 AM Performed by: Clearnce Sorrel, CRNA Pre-anesthesia Checklist: Patient identified, Emergency Drugs available, Suction available, Patient being monitored and Timeout performed Patient Re-evaluated:Patient Re-evaluated prior to induction Oxygen Delivery Method: Circle system utilized Preoxygenation: Pre-oxygenation with 100% oxygen Induction Type: IV induction LMA: LMA inserted LMA Size: 4.0 Number of attempts: 1 Placement Confirmation: positive ETCO2 and breath sounds checked- equal and bilateral Tube secured with: Tape Dental Injury: Teeth and Oropharynx as per pre-operative assessment

## 2019-07-16 NOTE — Anesthesia Postprocedure Evaluation (Signed)
Anesthesia Post Note  Patient: Edward Wall  Procedure(s) Performed: OPEN REDUCTION INTERNAL FIXATION (ORIF) WRIST FRACTURE (Right Wrist)     Patient location during evaluation: PACU Anesthesia Type: General Level of consciousness: awake and sedated Pain management: pain level controlled Vital Signs Assessment: post-procedure vital signs reviewed and stable Respiratory status: spontaneous breathing Cardiovascular status: stable Postop Assessment: no apparent nausea or vomiting Anesthetic complications: no    Last Vitals:  Vitals:   07/16/19 1225 07/16/19 1240  BP: 125/64 124/60  Pulse: 72 74  Resp: 14 12  Temp:  36.4 C  SpO2: 93% 94%    Last Pain:  Vitals:   07/16/19 1240  TempSrc:   PainSc: 0-No pain   Pain Goal:                   Huston Foley

## 2019-07-16 NOTE — Progress Notes (Signed)
Orthopedic Tech Progress Note Patient Details:  Edward Wall 1986-09-30 [id number]  Ortho Devices Type of Ortho Device: Arm sling Ortho Device/Splint Location: right Ortho Device/Splint Interventions: Application   Post Interventions Patient Tolerated: Well Instructions Provided: Care of device   [name] 07/16/2019, 1:06 PM

## 2019-07-16 NOTE — Op Note (Signed)
Orthopaedic Surgery Operative Note (CSN: [id number] ) Date of Surgery: 07/16/2019  Admit Date: 07/16/2019   Diagnoses: Pre-Op Diagnoses: Right intra-articular distal radius fracture Right nondisplaced clavicle fracture  Post-Op Diagnosis: Right intra-articular distal radius fracture Right dorsal hand laceration Right nondisplaced clavicle fracture  Procedures: 1. CPT 25609-Open reduction internal fixation of right distal radius fracture 2. CPT 12042-Irrigation and debridement with closure of 3 cm dorsal hand laceration 3. CPT 23500-Closed treatment of right clavicle fracture  Surgeons : Primary: Shona Needles, MD  Assistant: Patrecia Pace, PA-C  Location: OR 3   Anesthesia: General with block  Antibiotics: Ancef 2g preop   Tourniquet time:  Total Tourniquet Time Documented: Upper Arm (Right) - 55 minutes Total: Upper Arm (Right) - 55 minutes  Estimated Blood Loss:Minimal  Complications:None   Specimens:None   Implants: Implant Name Type Inv. Item Serial No. Manufacturer Lot No. LRB No. Used Action  PLATE DVR CROSSLOCK STD RT - [id number] Plate PLATE DVR CROSSLOCK STD RT  [employer](ORTH,TRAU,BIO,SG)  Right 1 Implanted  SCREW NONLOCK 2.7X18MM - [id number] Screw SCREW NONLOCK 2.7X18MM  [employer](ORTH,TRAU,BIO,SG)  Right 1 Implanted  SCREW LOCKING 2.7X22MM - [id number] Screw SCREW LOCKING 2.7X22MM  [employer](ORTH,TRAU,BIO,SG)  Right 1 Implanted  SCREW LOCKING 2.7X24MM - [id number] Screw SCREW LOCKING 2.7X24MM  [employer](ORTH,TRAU,BIO,SG)  Right 3 Implanted  SCREW LOCKING 2.7X26MM - [id number] Screw SCREW LOCKING 2.7X26MM  [employer](ORTH,TRAU,BIO,SG)  Right 2 Implanted  SCREW  LP NL 2.7X16MM - [id number] Screw SCREW  LP NL 2.7X16MM  [employer](ORTH,TRAU,BIO,SG)  Right 1 Implanted  SCREW 2.7X14MM - [id number] Screw SCREW 2.7X14MM  [employer](ORTH,TRAU,BIO,SG)  Right 2 Implanted     Indications for Surgery: 33 year old male who fell off a second story balcony  sustained a right intra-articular distal radius fracture, right nondisplaced clavicle fracture and right-sided rib fractures with a liver laceration.  Due to the comminuted nature and intra-articular nature of his distal radius I recommended proceeding with open reduction internal fixation.  Risks and benefits were discussed with the patient.  Risks included but not limited to bleeding, infection, malunion, nonunion, hardware failure, hardware cut out, stiffness of the wrist, nerve and blood vessel injury, anesthetic complications.  The patient agreed to proceed with surgery and consent was obtained.  Operative Findings: 1. Comminuted, intra-articular right distal radius fracture treated with open reduction internal fixation using [employer] DVR distal radius plate. 2.  Nondisplaced right clavicle fracture manipulated in the operating room with no instability noted, treated nonoperatively 3.  3 cm dorsal laceration between the third and fourth metacarpal heads with no extension into the joint or any disruption of extensor tendons.  Treated with I&D and closure  Procedure: The patient was identified in the preoperative holding area. Consent was confirmed with the patient and their family and all questions were answered. The operative extremity was marked after confirmation with the patient. he was then brought back to the operating room by our anesthesia colleagues.  He was carefully transferred over to a regular OR table.  He was placed under general anesthetic.  I manipulated his right clavicle.  There is no instability noted on exam there is no motion or crepitus.  This was consistent with the nondisplaced fracture that was questionable on imaging.  A nonsterile tourniquet was placed to the upper arm the operative extremity was then prepped and draped in usual sterile fashion. A preoperative timeout was performed to verify the patient, the procedure, and the extremity. Preoperative antibiotics were  dosed.  Fluoroscopic imaging was  obtained.  It showed the unstable nature of his injury.  The tourniquet was inflated to 250 mmHg.  A standard FCR approach was made to the distal radius.  Is carried down through skin and subcutaneous tissue.  The fascia overlying the FCR was incised in line with the incision.  The FCR tendon was mobilized and the dorsal sheath was incised.  The finger flexors were mobilized and reflected.  The pronator quadratus was then incised off the radial border of the radius and peeled back.  I then mobilized the fracture performed a reduction and held provisionally with a radial styloid pin going from radial to ulna and distal to proximal.  This held the reduction provisionally.  I then chose a volar distal radius plate and positioned this appropriately using fluoroscopy and then held it provisionally with K wires.  A nonlocking screw was placed in the distal segment to bring it flush to bone.  I then placed a locking screws in the distal segment carefully measuring and identifying the location on fluoroscopy.  I confirmed that all screws were extra-articular.  I then placed 3 nonlocking screws into the radial shaft thereby completing the construct.  Final fluoroscopic images were obtained.  I then stressed the DRUJ in pronation, supination and neutral rotation.  There was no instability of the DRUJ.  I then focused on the dorsal skin laceration there is a 3 cm skin laceration that probe down to soft tissue.  There is no involvement of the extensor tendons.  There is no involvement of the underlying MCP joint.  This was irrigated and debrided of the traumatized skin edges.  The distal radius incision was irrigated and closed with 2-0 and 3-0 Monocryl.  The traumatic laceration was closed with 3-0 nylon.  Sterile dressings dressing consisting of bacitracin, Adaptic, 4 x 4's sterile cast padding and a volar short arm splint was placed.  The patient was then awoken from anesthesia and  taken the PACU in stable condition.  Post Op Plan/Instructions: The patient will be nonweightbearing to the right wrist.  He may be weightbearing as tolerated through his right elbow.  He will be weightbearing as tolerated bilateral lower extremities.  He will receive postoperative Ancef.  He will receive Lovenox for DVT prophylaxis at the discretion of the trauma team.  He may be discharged from an orthopedic perspective once cleared by trauma surgery.  I was present and performed the entire surgery.  Ulyses Southward, PA-C did assist me throughout the case. An assistant was necessary given the difficulty in approach, maintenance of reduction and ability to instrument the fracture.   Truitt Merle, MD [facility]

## 2019-07-16 NOTE — Anesthesia Procedure Notes (Signed)
Anesthesia Regional Block: Supraclavicular block   Pre-Anesthetic Checklist: ,, timeout performed, Correct Patient, Correct Site, Correct Laterality, Correct Procedure, Correct Position, site marked, Risks and benefits discussed,  Surgical consent,  Pre-op evaluation,  At surgeon's request and post-op pain management  Laterality: Upper and Right  Prep: chloraprep       Needles:  Injection technique: Single-shot  Needle Type: Echogenic Stimulator Needle     Needle Length: 10cm  Needle Gauge: 21   Needle insertion depth: 1 cm   Additional Needles:   Procedures:,,,, ultrasound used (permanent image in chart),,,,  Narrative:  Start time: 07/16/2019 9:15 AM End time: 07/16/2019 9:25 AM Injection made incrementally with aspirations every 5 mL.  Performed by: Personally  Anesthesiologist: Lyn Hollingshead, MD

## 2019-07-16 NOTE — Progress Notes (Signed)
Chaplain visited as a result of referral from night chaplain and nurse.  The patient expressed serious concern about how this injury has adversely affected their immediate life plans.  The patient also expressed anger and frustration at the whole situation.  The chaplain provided supportive conversation as well as emotional support.  The Chaplain will follow closely.  Brion Aliment Chaplain Resident For questions concerning this note please contact me by pager [phone]

## 2019-07-16 NOTE — ED Provider Notes (Signed)
LACERATION REPAIR Performed by: Montine Circle Authorized by: Montine Circle Consent: Verbal consent obtained. Risks and benefits: risks, benefits and alternatives were discussed Consent given by: patient Patient identity confirmed: provided demographic data Prepped and Draped in normal sterile fashion Wound explored  Laceration Location: chin  Laceration Length: 4cm  No Foreign Bodies seen or palpated  Anesthesia: local infiltration  Local anesthetic: lidocaine 2% with epinephrine  Anesthetic total: 4 ml  Irrigation method: syringe Amount of cleaning: standard  Skin closure: 5-0 vicryl rapide  Number of sutures: 8  Technique: interrupted  Patient tolerance: Patient tolerated the procedure well with no immediate complications.    Montine Circle, PA-C 07/16/19 0403    Merryl Hacker, MD 07/16/19 [phone]

## 2019-07-16 NOTE — Consult Note (Signed)
Orthopaedic Trauma Service (OTS) Consult   Patient ID: Edward Wall MRN: [id number] DOB/AGE: 1986/01/18 33 y.o.  Reason for Consult:Right distal radius and clavicle fracture Referring Physician: Dr. Ramond Marrow, MD w/ Edward Wall  HPI: Edward Wall is an 33 y.o. male who is being seen in consultation at the request of Dr. Everardo Pacific for evaluation of a right distal radius fracture and right clavicle fracture.  According to reports the patient fell from a second story balcony landing on his right arm and hitting his head.  Denies loss of consciousness.  He was ambulatory on the scene.  He was brought to the hospital with obvious wrist deformity.  On work-up he was found to have a liver laceration as well as right rib fractures.  Due to his complexity of his wrist fracture it was felt that an orthopedic traumatologist would be best manage this injury.  Patient was seen and evaluated on 4 N. progressive.  Currently complaints of right wrist pain left wrist pain and right knee pain.  Denies any numbness or tingling.  Does not significantly say that his shoulder hurts a lot.  He is currently still in a c-collar.  The pain worsens with motion.  He is not able to move his wrist he was not splinted in the emergency room.  Pain medication assist his pain.  History reviewed. No pertinent past medical history.  History reviewed. No pertinent surgical history.  History reviewed. No pertinent family history.  Social History:  has no history on file for tobacco, alcohol, and drug.  Allergies: No Known Allergies  Medications:  No current facility-administered medications on file prior to encounter.    No current outpatient medications on file prior to encounter.    ROS: Constitutional: No fever or chills Vision: No changes in vision ENT: No difficulty swallowing CV: No chest pain Pulm: No SOB or wheezing GI: No nausea or vomiting GU: No urgency or inability to hold urine Skin: No poor wound  healing Neurologic: No numbness or tingling Psychiatric: No depression or anxiety Heme: No bruising Allergic: No reaction to medications or food   Exam: Blood pressure 112/60, pulse 98, temperature (!) 96.2 F (35.7 C), temperature source Oral, resp. rate 18, height 5\' 8"  (1.727 m), weight 61.2 kg, SpO2 99 %. General: Some mild discomfort but overall no acute distress Orientation: Awake alert and oriented x3 Mood and Affect: Cooperative active and appropriate affect Gait: Did not assess ambulation but is able to move his legs appropriately. Coordination and balance: Within normal limits  Right upper extremity: Reveals obvious deformity about the wrist.  Pain with any attempted range of motion.  Significant tenderness to palpation.  Some multiple abrasions about his hand and wrist.  No lacerations no open wounds.  Range of motion of the elbow and shoulder is limited secondary to pain in his wrist.  However he is able to move those without overt deformity.  He has sensation intact to light touch to median, radial and ulnar nerve distribution, motor function is intact to median, radial and ulnar nerve as well.  He has a warm well-perfused hand with brisk cap refill of less than 2 seconds.  His reflexes are within normal limits.  He does not have any lymphadenopathy.  Left upper extremity: Skin without lesions, with some mild abrasions.  Some global tenderness to palpation about the wrist but no obvious deformity.  Some limited range of motion of the left wrist but no limitation in the elbow or shoulder, full strength  in each muscle groups without evidence of instability.  Right lower extremity: Mild abrasions no obvious deformity.  Mild effusion on knee exam.  Stable to varus and valgus stress and anterior and posterior drawer however he limited the exam secondary to pain.  He is able to bend his knee without significant discomfort.  No deformity about the hip or the ankle.  He is motor and sensory  function intact distally.  No evidence of instability otherwise.  Global tenderness to the knee.  Left lower extremity: Reveals skin without lesions.  No tenderness palpation.  Full painless range of motion.  Full strength in each muscle groups.  No evidence of instability.   Medical Decision Making: Data: Imaging: X-rays of the right wrist show a distal radius fracture with a dorsal angulation.  CT scan that was obtained shows intra-articular nature with a comminuted joint surface.  There is a ulnar styloid fracture.  X-rays of his chest and CT scan of the chest show a nondisplaced clavicle fracture.  Is not fully appreciated on these films.  Labs:  Results for orders placed or performed during the hospital encounter of 07/16/19 (from the past 24 hour(s))  CDS serology     Status: None   Collection Time: 07/16/19  2:41 AM  Result Value Ref Range   CDS serology specimen      SPECIMEN WILL BE HELD FOR 14 DAYS IF TESTING IS REQUIRED  Comprehensive metabolic panel     Status: Abnormal   Collection Time: 07/16/19  2:41 AM  Result Value Ref Range   Sodium 134 (L) 135 - 145 mmol/L   Potassium 3.9 3.5 - 5.1 mmol/L   Chloride 98 98 - 111 mmol/L   CO2 20 (L) 22 - 32 mmol/L   Glucose, Bld 122 (H) 70 - 99 mg/dL   BUN 7 6 - 20 mg/dL   Creatinine, Ser 1.611.09 0.61 - 1.24 mg/dL   Calcium 8.9 8.9 - 09.610.3 mg/dL   Total Protein 5.6 (L) 6.5 - 8.1 g/dL   Albumin 3.8 3.5 - 5.0 g/dL   AST 0,4541,472 (H) 15 - 41 U/L   ALT 297 (H) 0 - 44 U/L   Alkaline Phosphatase 87 38 - 126 U/L   Total Bilirubin 1.4 (H) 0.3 - 1.2 mg/dL   GFR calc non Af Amer >60 >60 mL/min   GFR calc Af Amer >60 >60 mL/min   Anion gap 16 (H) 5 - 15  CBC     Status: Abnormal   Collection Time: 07/16/19  2:41 AM  Result Value Ref Range   WBC 8.5 4.0 - 10.5 K/uL   RBC 3.94 (L) 4.22 - 5.81 MIL/uL   Hemoglobin 14.2 13.0 - 17.0 g/dL   HCT 09.841.1 11.939.0 - 14.752.0 %   MCV 104.3 (H) 80.0 - 100.0 fL   MCH 36.0 (H) 26.0 - 34.0 pg   MCHC 34.5 30.0 -  36.0 g/dL   RDW 82.914.3 56.211.5 - 13.015.5 %   Platelets 161 150 - 400 K/uL   nRBC 0.7 (H) 0.0 - 0.2 %  Ethanol     Status: Abnormal   Collection Time: 07/16/19  2:41 AM  Result Value Ref Range   Alcohol, Ethyl (B) 229 (H) <10 mg/dL  Protime-INR     Status: None   Collection Time: 07/16/19  2:41 AM  Result Value Ref Range   Prothrombin Time 14.7 11.4 - 15.2 seconds   INR 1.2 0.8 - 1.2  Type and screen Ordered by PROVIDER DEFAULT  Status: None   Collection Time: 07/16/19  3:13 AM  Result Value Ref Range   ABO/RH(D) O POS    Antibody Screen NEG    Sample Expiration      07/19/2019,2359 Performed at [facility], [address] [place], [place] [address]   Lactic acid, plasma     Status: Abnormal   Collection Time: 07/16/19  3:13 AM  Result Value Ref Range   Lactic Acid, Venous 5.4 (HH) 0.5 - 1.9 mmol/L  ABO/Rh     Status: None   Collection Time: 07/16/19  3:13 AM  Result Value Ref Range   ABO/RH(D)      O POS Performed at [facility], [address] [place], [place] [address]   I-stat chem 8, ED     Status: Abnormal   Collection Time: 07/16/19  3:16 AM  Result Value Ref Range   Sodium 134 (L) 135 - 145 mmol/L   Potassium 3.3 (L) 3.5 - 5.1 mmol/L   Chloride 94 (L) 98 - 111 mmol/L   BUN 7 6 - 20 mg/dL   Creatinine, Ser 0.98 (H) 0.61 - 1.24 mg/dL   Glucose, Bld 119 (H) 70 - 99 mg/dL   Calcium, Ion 1.47 (L) 1.15 - 1.40 mmol/L   TCO2 24 22 - 32 mmol/L   Hemoglobin 12.6 (L) 13.0 - 17.0 g/dL   HCT 82.9 (L) 56.2 - 13.0 %  SARS Coronavirus 2 [facility] order, Performed in [facility] hospital lab) Nasopharyngeal Nasopharyngeal Swab     Status: None   Collection Time: 07/16/19  3:28 AM   Specimen: Nasopharyngeal Swab  Result Value Ref Range   SARS Coronavirus 2 NEGATIVE NEGATIVE  CBC     Status: Abnormal   Collection Time: 07/16/19  8:00 AM  Result Value Ref Range   WBC 7.9 4.0 - 10.5 K/uL   RBC 3.41 (L) 4.22 - 5.81 MIL/uL   Hemoglobin 12.2 (L) 13.0 - 17.0 g/dL    HCT 86.5 (L) 78.4 - 52.0 %   MCV 104.7 (H) 80.0 - 100.0 fL   MCH 35.8 (H) 26.0 - 34.0 pg   MCHC 34.2 30.0 - 36.0 g/dL   RDW 69.6 29.5 - 28.4 %   Platelets 121 (L) 150 - 400 K/uL   nRBC 0.0 0.0 - 0.2 %  Comprehensive metabolic panel     Status: Abnormal   Collection Time: 07/16/19  8:00 AM  Result Value Ref Range   Sodium 137 135 - 145 mmol/L   Potassium 3.2 (L) 3.5 - 5.1 mmol/L   Chloride 104 98 - 111 mmol/L   CO2 23 22 - 32 mmol/L   Glucose, Bld 109 (H) 70 - 99 mg/dL   BUN 6 6 - 20 mg/dL   Creatinine, Ser 1.32 0.61 - 1.24 mg/dL   Calcium 8.5 (L) 8.9 - 10.3 mg/dL   Total Protein 5.2 (L) 6.5 - 8.1 g/dL   Albumin 3.4 (L) 3.5 - 5.0 g/dL   AST 4,401 (H) 15 - 41 U/L   ALT 287 (H) 0 - 44 U/L   Alkaline Phosphatase 77 38 - 126 U/L   Total Bilirubin 1.0 0.3 - 1.2 mg/dL   GFR calc non Af Amer >60 >60 mL/min   GFR calc Af Amer >60 >60 mL/min   Anion gap 10 5 - 15  Magnesium     Status: Abnormal   Collection Time: 07/16/19  8:00 AM  Result Value Ref Range   Magnesium 1.4 (L) 1.7 -  2.4 mg/dL  Phosphorus     Status: None   Collection Time: 07/16/19  8:00 AM  Result Value Ref Range   Phosphorus 2.8 2.5 - 4.6 mg/dL    Imaging or Labs ordered: Formal right knee x-rays and left wrist x-rays along with clavicle films will be obtained.  Medical history and chart was reviewed and case discussed with medical provider.  Assessment/Plan: 33 year old right-hand-dominant male fall from ladder with right intra-articular distal radius fracture with nondisplaced right clavicle fracture.  Patient will need to proceed with open reduction internal fixation to restore the articular surface and alignment of his wrist.  The clavicle is nondisplaced and does not need any surgical intervention.  I will get formal films postoperatively has the CT scan does not show it significantly from my review of the imaging.  I will also obtain x-rays of his right knee and left wrist to evaluate any possible injury there.   Risks and benefits were discussed with the patient.  Risks included but not limited to bleeding, infection, malunion, nonunion, posttraumatic arthritis, stiffness, nerve and blood vessel injury, compartment syndrome, anesthetic complications.  The patient agreed to proceed with surgery and consent was obtained.  Shona Needles, MD [facility] [phone] (phone) [phone] (office) [email or web address]
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
## 2019-07-16 NOTE — Progress Notes (Signed)
Chaplain responded to trauma level 1 page. Pt was talking and following commands. No family present upon arrival. Pt went to CT. Nurse will page chaplain in the event that spiritual care is needed. Chaplain remains available per request.  Chaplain Resident, Evelene Croon, M Div Pager # [phone]
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
## 2019-07-16 NOTE — Progress Notes (Signed)
Orthopedic Tech Progress Note Patient Details:  Edward Wall 10/21/1986 [id number]  Ortho Devices Type of Ortho Device: Ace wrap, Sugartong splint Ortho Device/Splint Location: right Ortho Device/Splint Interventions: Application   Post Interventions Patient Tolerated: Well Instructions Provided: Care of device   [name] 07/16/2019, 8:22 AM
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
## 2019-07-16 NOTE — Anesthesia Preprocedure Evaluation (Signed)
Anesthesia Evaluation  Patient identified by MRN, date of birth, ID band Patient awake    Reviewed: Allergy & Precautions, NPO status   Airway Mallampati: I       Dental no notable dental hx. (+) Teeth Intact   Pulmonary neg pulmonary ROS,    Pulmonary exam normal breath sounds clear to auscultation       Cardiovascular negative cardio ROS Normal cardiovascular exam Rhythm:Regular Rate:Normal     Neuro/Psych negative neurological ROS  negative psych ROS   GI/Hepatic negative GI ROS, Neg liver ROS,   Endo/Other  negative endocrine ROS  Renal/GU negative Renal ROS  negative genitourinary   Musculoskeletal   Abdominal Normal abdominal exam  (+)   Peds  Hematology negative hematology ROS (+)   Anesthesia Other Findings   Reproductive/Obstetrics                             Anesthesia Physical Anesthesia Plan  ASA: I  Anesthesia Plan: General   Post-op Pain Management:  Regional for Post-op pain   Induction: Intravenous  PONV Risk Score and Plan:   Airway Management Planned: LMA  Additional Equipment:   Intra-op Plan:   Post-operative Plan: Extubation in OR  Informed Consent: I have reviewed the patients History and Physical, chart, labs and discussed the procedure including the risks, benefits and alternatives for the proposed anesthesia with the patient or authorized representative who has indicated his/her understanding and acceptance.     Dental advisory given  Plan Discussed with: CRNA  Anesthesia Plan Comments:         Anesthesia Quick Evaluation

## 2019-07-17 LAB — CBC
HCT: 32.8 % — ABNORMAL LOW (ref 39.0–52.0)
Hemoglobin: 11.2 g/dL — ABNORMAL LOW (ref 13.0–17.0)
MCH: 35.2 pg — ABNORMAL HIGH (ref 26.0–34.0)
MCHC: 34.1 g/dL (ref 30.0–36.0)
MCV: 103.1 fL — ABNORMAL HIGH (ref 80.0–100.0)
Platelets: 95 10*3/uL — ABNORMAL LOW (ref 150–400)
RBC: 3.18 MIL/uL — ABNORMAL LOW (ref 4.22–5.81)
RDW: 14.2 % (ref 11.5–15.5)
WBC: 8.1 10*3/uL (ref 4.0–10.5)
nRBC: 0 % (ref 0.0–0.2)

## 2019-07-17 LAB — VITAMIN D 25 HYDROXY (VIT D DEFICIENCY, FRACTURES): Vit D, 25-Hydroxy: 5.6 ng/mL — ABNORMAL LOW (ref 30.0–100.0)

## 2019-07-17 MED ORDER — OXYCODONE HCL 5 MG PO TABS
5.0000 mg | ORAL_TABLET | ORAL | Status: DC | PRN
  Administered 2019-07-17 – 2019-07-18 (×4): 10 mg via ORAL
  Filled 2019-07-17 (×4): qty 2

## 2019-07-17 MED ORDER — VITAMIN D (ERGOCALCIFEROL) 1.25 MG (50000 UNIT) PO CAPS
50000.0000 [IU] | ORAL_CAPSULE | ORAL | Status: DC
  Administered 2019-07-17: 11:00:00 50000 [IU] via ORAL
  Filled 2019-07-17: qty 1

## 2019-07-17 MED ORDER — VITAMIN D 25 MCG (1000 UNIT) PO TABS
2000.0000 [IU] | ORAL_TABLET | Freq: Two times a day (BID) | ORAL | Status: DC
  Administered 2019-07-17 – 2019-07-18 (×3): 2000 [IU] via ORAL
  Filled 2019-07-17 (×3): qty 2
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
## 2019-07-17 NOTE — Progress Notes (Signed)
CSW met with patient to engage in SBIRT per trauma protocol. Patient scored a total of 9 on their SBIRT exam. CSW provided psychoeducation on substance use and offered patient with resources as appropriate. CSW provided brief intervention to patient discussing patient's report of falling into pattern/habit of drinking 1-2 beers a night for the past two years. Patient accepted outpatient resources. Patient responded openly to discussions of substance use.   Daphine Deutscher, LCSW, [facility], [place], [place] [phone]
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
## 2019-07-17 NOTE — Progress Notes (Signed)
SPORTS MEDICINE AND JOINT REPLACEMENT  Georgena Spurling, MD    Laurier Nancy, PA-C [address] [place], [place], [place]  [address]                             [phone]   PROGRESS NOTE  Subjective:  negative for Chest Pain  negative for Shortness of Breath  negative for Nausea/Vomiting   negative for Calf Pain  negative for Bowel Movement   Tolerating Diet: yes         Patient reports pain as 3 on 0-10 scale.    Objective: Vital signs in last 24 hours:    Patient Vitals for the past 24 hrs:  BP Temp Temp src Pulse Resp SpO2  07/17/19 0517 - 98.4 F (36.9 C) Oral 63 13 -  07/17/19 0009 (!) 100/59 97.8 F (36.6 C) Oral (!) 56 13 96 %  07/16/19 2032 - 98.8 F (37.1 C) Oral 77 18 -  07/16/19 1618 128/66 98.3 F (36.8 C) Oral 75 (!) 25 96 %  07/16/19 1240 124/60 97.6 F (36.4 C) - 74 12 94 %  07/16/19 1225 125/64 - - 72 14 93 %  07/16/19 1210 (!) 119/54 97.7 F (36.5 C) - 72 13 98 %  07/16/19 1155 118/63 - - 65 18 98 %  07/16/19 1140 113/66 97.7 F (36.5 C) - 76 14 100 %  07/16/19 0930 99/61 - - 88 13 95 %  07/16/19 0925 (!) 106/56 - - 88 13 93 %  07/16/19 0920 100/69 - - 93 12 91 %  07/16/19 0915 (!) 95/45 - - 88 13 (!) 89 %  07/16/19 0910 (!) 86/46 - - 92 13 92 %    @flow {1959:LAST@   Intake/Output from previous day:   09/25 0701 - 09/26 0700 In: 3871.1 [P.O.:100; I.V.:2577.8] Out: 1225 [Urine:1200]   Intake/Output this shift:   No intake/output data recorded.   Intake/Output      09/25 0701 - 09/26 0700 09/26 0701 - 09/27 0700   P.O. 100    I.V. (mL/kg) 2577.8 (42.1)    IV Piggyback 1193.3    Total Intake(mL/kg) 3871.1 (63.3)    Urine (mL/kg/hr) 1200 (0.8)    Blood 25    Total Output 1225    Net +2646.1         Urine Occurrence 1 x       LABORATORY DATA: Recent Labs    07/16/19 0241 07/16/19 0316 07/16/19 0800 07/17/19 0208  WBC 8.5  --  7.9 8.1  HGB 14.2 12.6* 12.2* 11.2*  HCT 41.1 37.0* 35.7* 32.8*  PLT 161  --  121* 95*   Recent Labs     07/16/19 0241 07/16/19 0316 07/16/19 0800  NA 134* 134* 137  K 3.9 3.3* 3.2*  CL 98 94* 104  CO2 20*  --  23  BUN 7 7 6   CREATININE 1.09 1.30* 0.98  GLUCOSE 122* 111* 109*  CALCIUM 8.9  --  8.5*   Lab Results  Component Value Date   INR 1.2 07/16/2019    Examination:  General appearance: alert, cooperative and no distress Extremities: extremities normal, atraumatic, no cyanosis or edema  Wound Exam: clean, dry, intact   Drainage:  None: wound tissue dry  Motor Exam: Opposition, Pinch and Wrist Dorsiflexion Intact  Sensory Exam: Radial, Ulnar and Median normal   Assessment:    1 Day Post-Op  Procedure(s) (LRB): OPEN REDUCTION INTERNAL FIXATION (  ORIF) WRIST FRACTURE (Right)  ADDITIONAL DIAGNOSIS:  Active Problems:   Fall from balcony   Closed fracture of right distal radius and ulna, initial encounter   Closed nondisplaced fracture of right clavicle   Liver laceration, closed, initial encounter   Multiple rib fractures     Plan:  Patient doing well, in mild pain. Splint is intact. Concerned about work and getting out of the hospital. All questions were answered. Ok to D/C home from ortho standpoint. Trauma following. WIll follow up in office with Dr Doreatha Martin  Donia Ast 07/17/2019, 7:25 AM

## 2019-07-18 LAB — CBC
HCT: 34 % — ABNORMAL LOW (ref 39.0–52.0)
Hemoglobin: 11.8 g/dL — ABNORMAL LOW (ref 13.0–17.0)
MCH: 35.3 pg — ABNORMAL HIGH (ref 26.0–34.0)
MCHC: 34.7 g/dL (ref 30.0–36.0)
MCV: 101.8 fL — ABNORMAL HIGH (ref 80.0–100.0)
Platelets: 97 10*3/uL — ABNORMAL LOW (ref 150–400)
RBC: 3.34 MIL/uL — ABNORMAL LOW (ref 4.22–5.81)
RDW: 13.9 % (ref 11.5–15.5)
WBC: 8.5 10*3/uL (ref 4.0–10.5)
nRBC: 0 % (ref 0.0–0.2)

## 2019-07-18 MED ORDER — OXYCODONE HCL 5 MG PO TABS: 5.0000 mg | ORAL_TABLET | Freq: Four times a day (QID) | ORAL | 0 refills | Status: DC | PRN
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
## 2019-07-18 NOTE — Discharge Summary (Signed)
Physician Discharge Summary  Patient ID: Edward Wall MRN: [id number] DOB/AGE: August 14, 1986 33 y.o.  Admit date: 07/16/2019 Discharge date: 07/18/2019  Admission Diagnoses:  Discharge Diagnoses:  Active Problems:   Fall from balcony   Closed fracture of right distal radius and ulna, initial encounter   Closed nondisplaced fracture of right clavicle   Liver laceration, closed, initial encounter   Multiple rib fractures   Discharged Condition: good  Hospital Course: admitted to trauma service s/p fall.  Multiple injuries.  Take to OR for fracture repair of right wrist. Remained hemodynamically stable with liver injury.  HGB remained stable. No needs for home after PT evaluation. Discharged home  Consults: orthopedic surgery  Significant Diagnostic Studies:   Treatments: bed rest, hemodynamic monitoring, Ortho repair of wrist fracture  Discharge Exam: Blood pressure 104/75, pulse 75, temperature 98.9 F (37.2 C), temperature source Oral, resp. rate 13, height 5\' 8"  (1.727 m), weight 61.2 kg, SpO2 100 %. General appearance: alert, cooperative and no distress Resp: clear to auscultation bilaterally Cardio: regular rate and rhythm, S1, S2 normal, no murmur, click, rub or gallop GI: soft, non-tender; bowel sounds normal; no masses,  no organomegaly right right cast in place Facial lacerations healing well Disposition: Discharge disposition: 01-Home or Self Care         Follow-up Information    Haddix, Thomasene Lot, MD. Schedule an appointment as soon as possible for a visit in 2 week(s).   Specialty: Orthopedic Surgery Contact information: [place] [address] [address] [place]. Schedule an appointment as soon as possible for a visit in 2 week(s).   Contact information: [place] [id number] [phone]          Signed: Coralie Keens 07/18/2019, 10:58 AM

## 2019-07-18 NOTE — Discharge Instructions (Addendum)
Cast or Splint Care, Adult °Casts and splints are supports that are worn to protect broken bones and other injuries. A cast or splint may hold a bone still and in the correct position while it heals. Casts and splints may also help to ease pain, swelling, and muscle spasms. °How to care for your cast ° °· Do not stick anything inside the cast to scratch your skin. °· Check the skin around the cast every day. Tell your doctor about any concerns. °· You may put lotion on dry skin around the edges of the cast. Do not put lotion on the skin under the cast. °· Keep the cast clean. °· If the cast is not waterproof: °? Do not let it get wet. °? Cover it with a watertight covering when you take a bath or a shower. °How to care for your splint ° °· Wear it as told by your doctor. Take it off only as told by your doctor. °· Loosen the splint if your fingers or toes tingle, get numb, or turn cold and blue. °· Keep the splint clean. °· If the splint is not waterproof: °? Do not let it get wet. °? Cover it with a watertight covering when you take a bath or a shower. °Follow these instructions at home: °Bathing °· Do not take baths or swim until your doctor says it is okay. Ask your doctor if you can take showers. You may only be allowed to take sponge baths for bathing. °· If your cast or splint is not waterproof, cover it with a watertight covering when you take a bath or shower. °Managing pain, stiffness, and swelling °· Move your fingers or toes often to avoid stiffness and to lessen swelling. °· Raise (elevate) the injured area above the level of your heart while sitting or lying down. °Safety °· Do not use the injured limb to support your body weight until your doctor says that it is okay. °· Use crutches or other assistive devices as told by your doctor. °General instructions °· Do not put pressure on any part of the cast or splint until it is fully hardened. This may take many hours. °· Return to your normal activities as  told by your doctor. Ask your doctor what activities are safe for you. °· Keep all follow-up visits as told by your doctor. This is important. °Contact a doctor if: °· Your cast or splint gets damaged. °· The skin around the cast gets red or raw. °· The skin under the cast is very itchy or painful. °· Your cast or splint feels very uncomfortable. °· Your cast or splint is too tight or too loose. °· Your cast becomes wet or it starts to have a soft spot or area. °· You get an object stuck under your cast. °Get help right away if: °· Your pain gets worse. °· The injured area tingles, gets numb, or turns blue and cold. °· The part of your body above or below the cast is swollen and it turns a different color (is discolored). °· You cannot feel or move your fingers or toes. °· There is fluid leaking through the cast. °· You have very bad pain or pressure under the cast. °· You have trouble breathing. °· You have shortness of breath. °· You have chest pain. °This information is not intended to replace advice given to you by your health care provider. Make sure you discuss any questions you have with your health care provider. °Document Released:   02/06/2011 Document Revised: 01/27/2019 Document Reviewed: 09/27/2016 Elsevier Patient Education  2020 Elsevier Inc.    RIB FRACTURES  HOME INSTRUCTIONS   1. PAIN CONTROL:  1. Pain is best controlled by a usual combination of three different methods TOGETHER:  i. Ice/Heat ii. Over the counter pain medication iii. Prescription pain medication 2. You may experience some swelling and bruising in area of broken ribs. Ice packs or heating pads (30-60 minutes up to 6 times a day) will help. Use ice for the first few days to help decrease swelling and bruising, then switch to heat to help relax tight/sore spots and speed recovery. Some people prefer to use ice alone, heat alone, alternating between ice & heat. Experiment to what works for you. Swelling and bruising can  take several weeks to resolve.  3. It is helpful to take an over-the-counter pain medication regularly for the first few weeks. Choose one of the following that works best for you:  i. Naproxen (Aleve, etc) Two 220mg  tabs twice a day ii. Ibuprofen (Advil, etc) Three 200mg  tabs four times a day (every meal & bedtime) iii. Acetaminophen (Tylenol, etc) 500-650mg  four times a day (every meal & bedtime) 4. A prescription for pain medication (such as oxycodone, hydrocodone, etc) may be given to you upon discharge. Take your pain medication as prescribed.  i. If you are having problems/concerns with the prescription medicine (does not control pain, nausea, vomiting, rash, itching, etc), please call us [phone] to see if we need to switch you to a different pain medicine that will work better for you and/or control your side effect better. ii. If you need a refill on your pain medication, please contact your pharmacy. They will contact our office to request authorization. Prescriptions will not be filled after 5 pm or on week-ends. 1. Avoid getting constipated. When taking pain medications, it is common to experience some constipation. Increasing fluid intake and taking a fiber supplement (such as Metamucil, Citrucel, FiberCon, MiraLax, etc) 1-2 times a day regularly will usually help prevent this problem from occurring. A mild laxative (prune juice, Milk of Magnesia, MiraLax, etc) should be taken according to package directions if there are no bowel movements after 48 hours.  2. Watch out for diarrhea. If you have many loose bowel movements, simplify your diet to bland foods & liquids for a few days. Stop any stool softeners and decrease your fiber supplement. Switching to mild anti-diarrheal medications (Kayopectate, Pepto Bismol) can help. If this worsens or does not improve, please call us. 3. FOLLOW UP  a. Please call our office to set up or confirm an appointment for follow up for 2 weeks after  discharge.  b. If you have any orthopedic or other injuries you will need to follow up as outlined in your follow up instructions.   WHEN TO CALL US [phone]:  1. Poor pain control 2. Reactions / problems with new medications (rash/itching, nausea, etc)  3. Fever over 101.5 F (38.5 C) 4. Worsening swelling or bruising 5. Redness, drainage, pain or swelling around chest tube site 6. Worsening pain, productive cough, difficulty breathing or any other concerning symptoms  The clinic staff is available to answer your questions during regular business hours (8:30am-5pm). Please dont hesitate to call and ask to speak to one of our nurses for clinical concerns.  If you have a medical emergency, go to the nearest emergency room or call 911.  A surgeon from [facility] is always on call at  the [facility], [address], [place], [place], [place] [address] ?  MAIN: [phone] ? TOLL FREE: [phone] ?  FAX [phone]) [phone]  [email or web address]      Information on Rib Fractures  A rib fracture is a break or crack in one of the bones of the ribs. The ribs are long, curved bones that wrap around your chest and attach to your spine and your breastbone. The ribs protect your heart, lungs, and other organs in the chest. A broken or cracked rib is often painful but is not usually serious. Most rib fractures heal on their own over time. However, rib fractures can be more serious if multiple ribs are broken or if broken ribs move out of place and push against other structures or organs. What are the causes? This condition is caused by:  Repetitive movements with high force, such as pitching a baseball or having severe coughing spells.  A direct blow to the chest, such as a sports injury, a car accident, or a fall.  Cancer that has spread to the bones, which can weaken bones and cause them to break. What are the signs or  symptoms? Symptoms of this condition include:  Pain when you breathe in or cough.  Pain when someone presses on the injured area.  Feeling short of breath. How is this diagnosed? This condition is diagnosed with a physical exam and medical history. Imaging tests may also be done, such as:  Chest X-ray.  CT scan.  MRI.  Bone scan.  Chest ultrasound. How is this treated? Treatment for this condition depends on the severity of the fracture. Most rib fractures usually heal on their own in 1-3 months. Sometimes healing takes longer if there is a cough that does not stop or if there are other activities that make the injury worse (aggravating factors). While you heal, you will be given medicines to control the pain. You will also be taught deep breathing exercises. Severe injuries may require hospitalization or surgery. Follow these instructions at home: Managing pain, stiffness, and swelling  If directed, apply ice to the injured area. ? Put ice in a plastic bag. ? Place a towel between your skin and the bag. ? Leave the ice on for 20 minutes, 2-3 times a day.  Take over-the-counter and prescription medicines only as told by your health care provider. Activity  Avoid a lot of activity and any activities or movements that cause pain. Be careful during activities and avoid bumping the injured rib.  Slowly increase your activity as told by your health care provider. General instructions  Do deep breathing exercises as told by your health care provider. This helps prevent pneumonia, which is a common complication of a broken rib. Your health care provider may instruct you to: ? Take deep breaths several times a day. ? Try to cough several times a day, holding a pillow against the injured area. ? Use a device called incentive spirometer to practice deep breathing several times a day.  Drink enough fluid to keep your urine pale yellow.  Do not wear a rib belt or binder. These  restrict breathing, which can lead to pneumonia.  Keep all follow-up visits as told by your health care provider. This is important. Contact a health care provider if:  You have a fever. Get help right away if:  You have difficulty breathing or you are short of breath.  You develop a cough that does not  stop, or you cough up thick or bloody sputum.  You have nausea, vomiting, or pain in your abdomen.  Your pain gets worse and medicine does not help. Summary  A rib fracture is a break or crack in one of the bones of the ribs.  A broken or cracked rib is often painful but is not usually serious.  Most rib fractures heal on their own over time.  Treatment for this condition depends on the severity of the fracture.  Avoid a lot of activity and any activities or movements that cause pain. This information is not intended to replace advice given to you by your health care provider. Make sure you discuss any questions you have with your health care provider. Document Released: 10/07/2005 Document Revised: 01/06/2017 Document Reviewed: 01/06/2017 Elsevier Interactive Patient Education  2019 [employer].

## 2019-07-19 ENCOUNTER — Encounter (HOSPITAL_COMMUNITY): Payer: Self-pay | Admitting: Family Medicine

## 2019-07-19 ENCOUNTER — Encounter (HOSPITAL_COMMUNITY): Payer: Self-pay | Admitting: Student

## 2019-07-19 ENCOUNTER — Encounter: Payer: Self-pay | Admitting: Student

## 2019-07-19 DIAGNOSIS — S61411A Laceration without foreign body of right hand, initial encounter: Secondary | ICD-10-CM | POA: Insufficient documentation

## 2019-09-07 ENCOUNTER — Other Ambulatory Visit: Payer: Self-pay

## 2019-09-07 ENCOUNTER — Ambulatory Visit: Payer: No Typology Code available for payment source | Attending: Student

## 2019-09-07 DIAGNOSIS — M25531 Pain in right wrist: Secondary | ICD-10-CM | POA: Insufficient documentation

## 2019-09-07 DIAGNOSIS — M25522 Pain in left elbow: Secondary | ICD-10-CM | POA: Diagnosis present

## 2019-09-07 DIAGNOSIS — M25622 Stiffness of left elbow, not elsewhere classified: Secondary | ICD-10-CM | POA: Diagnosis present

## 2019-09-07 DIAGNOSIS — M25631 Stiffness of right wrist, not elsewhere classified: Secondary | ICD-10-CM | POA: Diagnosis present

## 2019-09-07 DIAGNOSIS — S62102D Fracture of unspecified carpal bone, left wrist, subsequent encounter for fracture with routine healing: Secondary | ICD-10-CM | POA: Diagnosis present

## 2019-09-07 DIAGNOSIS — S42402D Unspecified fracture of lower end of left humerus, subsequent encounter for fracture with routine healing: Secondary | ICD-10-CM

## 2019-09-07 NOTE — Therapy (Signed)
[facility] Outpatient Rehabilitation [facility] [address] [place], [place], [address] Phone: [phone]   Fax:  [phone]  Physical Therapy Evaluation  Patient Details  Name: Edward Wall MRN: [id number] Date of Birth: 09/10/1986 Referring Provider (PT): Edward Southward   PA-C   Encounter Date: 09/07/2019  PT End of Session - 09/07/19 0840    Visit Number  1    Number of Visits  24    Date for PT Re-Evaluation  11/26/19    Authorization Type  self pay    PT Start Time  0830    PT Stop Time  0915    PT Time Calculation (min)  45 min    Activity Tolerance  Patient tolerated treatment well;Patient limited by pain    Behavior During Therapy  [facility] for tasks assessed/performed       History reviewed. No pertinent past medical history.  Past Surgical History:  Procedure Laterality Date  . ORIF WRIST FRACTURE Right 07/16/2019   Procedure: OPEN REDUCTION INTERNAL FIXATION (ORIF) WRIST FRACTURE;  Surgeon: Edward Lofts, MD;  Location: [facility] OR;  Service: Orthopedics;  Laterality: Right;    There were no vitals filed for this visit.   Subjective Assessment - 09/07/19 0833    Subjective  He reports takled off of a balcony and fractured RT radius, RT clavicle fracture amd LT radial head fracture.  ORIF RT radiAL FRACTURE    Limitations  --   hARD TO OPEN DOORS AND BOTTLES. , lifting.   Currently in Pain?  Yes    Pain Score  --   unable to rate pain   Pain Location  Wrist   and LT elbow   Pain Orientation  Right    Pain Descriptors / Indicators  Aching    Pain Type  Surgical pain    Pain Onset  More than a month ago    Pain Frequency  Constant    Aggravating Factors   using RT/LT arm    Pain Relieving Factors  meds, rest         [facility] PT Assessment - 09/07/19 0001      Assessment   Medical Diagnosis  RT wrist and LT elbow fracture    Referring Provider (PT)  Edward Southward   PA-C    Onset Date/Surgical Date  07/16/19    Hand Dominance  Right    Next  MD Visit  09/14/19    Prior Therapy  No      Precautions   Precautions  None      Restrictions   Weight Bearing Restrictions  No      Balance Screen   Has the patient fallen in the past 6 months  Yes    How many times?  1    Has the patient had a decrease in activity level because of a fear of falling?   Yes    Is the patient reluctant to leave their home because of a fear of falling?   No      Prior Function   Level of Independence  Independent    Vocation  Part time employment    Financial planner,  waiting tables      Cognition   Overall Cognitive Status  Within Functional Limits for tasks assessed      ROM / Strength   AROM / PROM / Strength  AROM;PROM;Strength      AROM   Overall AROM Comments  LT wrist supination  50  pronation 45   RT  supination   0  pronation   5    AROM Assessment Site  Wrist;Elbow    Right/Left Elbow  Right;Left    Right Elbow Flexion  150    Right Elbow Extension  0    Left Elbow Flexion  120    Left Elbow Extension  -42    Right/Left Wrist  Right    Right Wrist Extension  0 Degrees    Right Wrist Flexion  18 Degrees    Right Wrist Radial Deviation  5 Degrees    Right Wrist Ulnar Deviation  0 Degrees    Left Wrist Extension  54 Degrees    Left Wrist Flexion  58 Degrees    Left Wrist Radial Deviation  20 Degrees    Left Wrist Ulnar Deviation  20 Degrees      PROM   Overall PROM Comments  All restricted motion increased 5-10 degrees with stretch      Strength   Overall Strength Comments  Not assessed                Objective measurements completed on examination: See above findings.                PT Short Term Goals - 09/07/19 0935      PT SHORT TERM GOAL #1   Title  He will be indpendent with all initial HEP    Time  4    Period  Weeks    Status  New      PT SHORT TERM GOAL #2   Title  He will improve LT elbow flexion and extension to equal RT active    Time  4    Period  Weeks    Status   New      PT SHORT TERM GOAL #3   Title  He will be able to supinate RT wrist    +15 degrees    Time  4    Period  Weeks    Status  New      PT SHORT TERM GOAL #4   Title  Active RT wrist extension to 15 degrees    Time  4    Period  Weeks    Status  New        PT Long Term Goals - 09/07/19 [id number]      PT LONG TERM GOAL #1   Title  He will be independent with all hEP    Time  12    Period  Weeks    Status  New      PT LONG TERM GOAL #2   Title  He will be able to reach overhead with  5#   to access overhead cabinets.    Time  12    Period  Weeks    Status  New      PT LONG TERM GOAL #3   Title  He will be able to open bottles and jars,  and open doors with min to no pain.    Time  12    Period  Weeks    Status  New      PT LONG TERM GOAL #4   Title  He will return to driving and normal work activity    Time  12    Period  Weeks    Status  New      PT LONG TERM GOAL #5   Title  [employer]  score will decreased to 44% limited or better to demo percieved functional improvement    Time  12    Period  Weeks    Status  New             Plan - 09/07/19 0910    Clinical Impression Statement  [name] presents with multiple fractures post fall . He is now with stiffnes sof right wrist and shoulder and LT elbow . pain in left elbow right shoulder and wrist. He is limted in use of both arms and is now not working. Skilled PT and consistent HEP shoulde improve function to return to work and normal home activity    Examination-Activity Limitations  Bathing;Reach Overhead;Carry;Lift    Examination-Participation Restrictions  Community Activity   home task and work.   Stability/Clinical Decision Making  Evolving/Moderate complexity    Clinical Decision Making  Moderate    Rehab Potential  Good    PT Frequency  2x / week    PT Duration  12 weeks    PT Treatment/Interventions  Taping;Manual techniques;Patient/family education;Therapeutic activities;Therapeutic  exercise;Cryotherapy;Moist Heat;Passive range of motion    PT Next Visit Plan  active and passive  ROM , STW   review HEP ( he is modifying)   heat or cold    PT Home Exercise Plan  wrist flex/ext/supination/pronation elbow flex/ext, shoulder flexion /ER    Consulted and Agree with Plan of Care  Patient       Patient will benefit from skilled therapeutic intervention in order to improve the following deficits and impairments:  Pain, Decreased range of motion, Decreased activity tolerance, Decreased strength, Impaired UE functional use  Visit Diagnosis: Closed fracture of left wrist with routine healing, subsequent encounter  Closed fracture of left elbow with routine healing, subsequent encounter  Pain in right wrist  Pain in left elbow  Stiffness of left elbow, not elsewhere classified  Stiffness of right wrist, not elsewhere classified     Problem List Patient Active Problem List   Diagnosis Date Noted  . Laceration of right hand 07/19/2019  . Fall from balcony 07/16/2019  . Closed fracture of right distal radius and ulna, initial encounter 07/16/2019  . Closed nondisplaced fracture of right clavicle 07/16/2019  . Liver laceration, closed, initial encounter 07/16/2019  . Multiple rib fractures 07/16/2019    [name]  PT 09/07/2019, 10:00 AM  [place], [place], [address] Phone: [phone]   Fax:  [phone]  Name: Edward Wall MRN: [id number] Date of Birth: 06/24/1986

## 2019-09-20 ENCOUNTER — Other Ambulatory Visit: Payer: Self-pay

## 2019-09-20 ENCOUNTER — Encounter: Payer: Self-pay | Admitting: Physical Therapy

## 2019-09-20 ENCOUNTER — Ambulatory Visit: Payer: No Typology Code available for payment source | Admitting: Physical Therapy

## 2019-09-20 DIAGNOSIS — M25531 Pain in right wrist: Secondary | ICD-10-CM

## 2019-09-20 DIAGNOSIS — S62102D Fracture of unspecified carpal bone, left wrist, subsequent encounter for fracture with routine healing: Secondary | ICD-10-CM

## 2019-09-20 DIAGNOSIS — S42402D Unspecified fracture of lower end of left humerus, subsequent encounter for fracture with routine healing: Secondary | ICD-10-CM

## 2019-09-20 DIAGNOSIS — M25622 Stiffness of left elbow, not elsewhere classified: Secondary | ICD-10-CM

## 2019-09-20 DIAGNOSIS — M25522 Pain in left elbow: Secondary | ICD-10-CM

## 2019-09-20 DIAGNOSIS — M25631 Stiffness of right wrist, not elsewhere classified: Secondary | ICD-10-CM

## 2019-09-20 NOTE — Therapy (Signed)
[facility] Outpatient Rehabilitation [facility] [address] [place], [place], [address] Phone: [phone]   Fax:  [phone]  Physical Therapy Treatment  Patient Details  Name: Edward Wall MRN: [id number] Date of Birth: 02/06/1986 Referring Provider (PT): Ulyses Southward   PA-C   Encounter Date: 09/20/2019  PT End of Session - 09/20/19 0941    Visit Number  2    Number of Visits  24    Date for PT Re-Evaluation  11/26/19    Authorization Type  self pay    PT Start Time  0933    PT Stop Time  1021    PT Time Calculation (min)  48 min       History reviewed. No pertinent past medical history.  Past Surgical History:  Procedure Laterality Date  . ORIF WRIST FRACTURE Right 07/16/2019   Procedure: OPEN REDUCTION INTERNAL FIXATION (ORIF) WRIST FRACTURE;  Surgeon: Roby Lofts, MD;  Location: [facility] OR;  Service: Orthopedics;  Laterality: Right;    There were no vitals filed for this visit.  Subjective Assessment - 09/20/19 0938    Subjective  Right wrist is constantly in pain. The shoulder I think is fine. I think I can bend my elbow better on the right. The right wrist is stiff.    Currently in Pain?  Yes    Pain Score  7     Pain Location  Wrist    Pain Orientation  Right    Pain Descriptors / Indicators  Aching   stiffness   Pain Type  Surgical pain    Aggravating Factors   lifting, gripping, taking lids off.    Pain Relieving Factors  meds, rest                       [facility] Adult PT Treatment/Exercise - 09/20/19 0001      Elbow Exercises   Forearm Supination  AROM    Forearm Supination Limitations  using wooden handle    Forearm Pronation  AROM    Forearm Pronation Limitations  using wooden handle       Hand Exercises   DIPJ Flexion  AROM;Self ROM    DIPJ Extension  AROM;Self ROM    Thumb Opposition  Right hand     Other Hand Exercises  Rolled towel squeeze right hand 20 reps 5 seconds       Wrist Exercises   Wrist Flexion   AROM;Self ROM    Wrist Extension  AROM;Self ROM    Wrist Radial Deviation  AROM;Self ROM      Modalities   Modalities  Moist Heat      Moist Heat Therapy   Number Minutes Moist Heat  10 Minutes    Moist Heat Location  Wrist   left             PT Education - 09/20/19 1050    Education Details  HEP    Person(s) Educated  Patient    Methods  Explanation;Handout    Comprehension  Returned demonstration       PT Short Term Goals - 09/07/19 0935      PT SHORT TERM GOAL #1   Title  He will be indpendent with all initial HEP    Time  4    Period  Weeks    Status  New      PT SHORT TERM GOAL #2   Title  He will improve LT elbow flexion and extension to  equal RT active    Time  4    Period  Weeks    Status  New      PT SHORT TERM GOAL #3   Title  He will be able to supinate RT wrist    +15 degrees    Time  4    Period  Weeks    Status  New      PT SHORT TERM GOAL #4   Title  Active RT wrist extension to 15 degrees    Time  4    Period  Weeks    Status  New        PT Long Term Goals - 09/07/19 5102      PT LONG TERM GOAL #1   Title  He will be independent with all hEP    Time  12    Period  Weeks    Status  New      PT LONG TERM GOAL #2   Title  He will be able to reach overhead with  5#   to access overhead cabinets.    Time  12    Period  Weeks    Status  New      PT LONG TERM GOAL #3   Title  He will be able to open bottles and jars,  and open doors with min to no pain.    Time  12    Period  Weeks    Status  New      PT LONG TERM GOAL #4   Title  He will return to driving and normal work activity    Time  12    Period  Weeks    Status  New      PT LONG TERM GOAL #5   Title  FOTO score will decreased to 44% limited or better to demo percieved functional improvement    Time  12    Period  Weeks    Status  New            Plan - 09/20/19 1243    Clinical Impression Statement  Pt arrives reporting compliance with HEP and improvement  in right elbow flexion. He continues with stiffness limiting right wrist ROM. Reviewed HEP and progressed with grip strength and assisted ROM for wrist and DIPs. Trial of HMP at end of session.    PT Next Visit Plan  active and passive  ROM , STW   review HEP ( he is modifying)   heat or cold    PT Home Exercise Plan  wrist flex/ext/supination/pronation elbow flex/ext, shoulder flexion /ER, added towel squeeze, Pronation/ Supination with light handle, DIP self assisted flexion and extension.       Patient will benefit from skilled therapeutic intervention in order to improve the following deficits and impairments:  Pain, Decreased range of motion, Decreased activity tolerance, Decreased strength, Impaired UE functional use  Visit Diagnosis: Closed fracture of left wrist with routine healing, subsequent encounter  Closed fracture of left elbow with routine healing, subsequent encounter  Pain in right wrist  Pain in left elbow  Stiffness of left elbow, not elsewhere classified  Stiffness of right wrist, not elsewhere classified     Problem List Patient Active Problem List   Diagnosis Date Noted  . Laceration of right hand 07/19/2019  . Fall from balcony 07/16/2019  . Closed fracture of right distal radius and ulna, initial encounter 07/16/2019  . Closed nondisplaced fracture of right clavicle  07/16/2019  . Liver laceration, closed, initial encounter 07/16/2019  . Multiple rib fractures 07/16/2019    Sherrie Mustacheonoho, Chenee Munns McGee, PTA 09/20/2019, 12:59 PM  [facility] [address] [place], [place], [address] Phone: [phone]   Fax:  [phone]  Name: Edward Wall MRN: [id number] Date of Birth: 02/03/1986

## 2019-09-23 ENCOUNTER — Ambulatory Visit: Payer: No Typology Code available for payment source | Attending: Student

## 2019-09-23 ENCOUNTER — Other Ambulatory Visit: Payer: Self-pay

## 2019-09-23 DIAGNOSIS — S42402D Unspecified fracture of lower end of left humerus, subsequent encounter for fracture with routine healing: Secondary | ICD-10-CM | POA: Insufficient documentation

## 2019-09-23 DIAGNOSIS — M25622 Stiffness of left elbow, not elsewhere classified: Secondary | ICD-10-CM | POA: Diagnosis present

## 2019-09-23 DIAGNOSIS — M25531 Pain in right wrist: Secondary | ICD-10-CM | POA: Diagnosis present

## 2019-09-23 DIAGNOSIS — M25522 Pain in left elbow: Secondary | ICD-10-CM | POA: Diagnosis present

## 2019-09-23 DIAGNOSIS — M25631 Stiffness of right wrist, not elsewhere classified: Secondary | ICD-10-CM | POA: Diagnosis present

## 2019-09-23 DIAGNOSIS — S62102D Fracture of unspecified carpal bone, left wrist, subsequent encounter for fracture with routine healing: Secondary | ICD-10-CM | POA: Diagnosis present

## 2019-09-23 NOTE — Therapy (Signed)
[facility] [address] [place], [place], [address] Phone: [phone]   Fax:  [phone]  Physical Therapy Treatment  Patient Details  Name: Edward Wall MRN: [id number] Date of Birth: 1986-02-12 Referring Provider (PT): Ulyses Southward   PA-C   Encounter Date: 09/23/2019  PT End of Session - 09/23/19 0917    Visit Number  3    Number of Visits  24    Date for PT Re-Evaluation  11/26/19    Authorization Type  self pay    PT Start Time  0915    PT Stop Time  1005    PT Time Calculation (min)  50 min    Activity Tolerance  Patient tolerated treatment well;Patient limited by pain    Behavior During Therapy  [facility] for tasks assessed/performed       History reviewed. No pertinent past medical history.  Past Surgical History:  Procedure Laterality Date  . ORIF WRIST FRACTURE Right 07/16/2019   Procedure: OPEN REDUCTION INTERNAL FIXATION (ORIF) WRIST FRACTURE;  Surgeon: Roby Lofts, MD;  [facility];  Service: Orthopedics;  Laterality: Right;    There were no vitals filed for this visit.  Subjective Assessment - 09/23/19 0917    Subjective  min pain at rest but with use 5-6/10    Pain Score  5     Pain Location  Wrist    Pain Orientation  Right    Pain Descriptors / Indicators  Aching    Pain Type  Surgical pain    Pain Onset  More than a month ago    Pain Frequency  Constant    Aggravating Factors   using RT hand    Pain Relieving Factors  meds , rest         [facility] PT Assessment - 09/23/19 0001      AROM   Left Elbow Flexion  135    Left Elbow Extension  -22                   [facility] Adult PT Treatment/Exercise - 09/23/19 0001      Elbow Exercises   Forearm Supination  AROM    Forearm Supination Limitations  with pronation and supination each direction    Forearm Pronation  AROM    Forearm Pronation Limitations  with pronsation and supination each direction      Hand Exercises   DIPJ  Flexion  AROM;Self ROM    DIPJ Extension  AROM;Self ROM    Thumb Opposition  Right hand    x15    Other Hand Exercises  prayer stretch for wrist extension ,   punches at shoulder level with clinched fist      Wrist Exercises   Wrist Flexion  AROM;Self ROM;20 reps    Wrist Extension  AROM;Self ROM;20 reps    Wrist Radial Deviation  Right;AROM;Self ROM;20 reps      Manual To fingers /hand and wrist ROM and Jt mobs all planes         PT Short Term Goals - 09/07/19 0935      PT SHORT TERM GOAL #1   Title  He will be indpendent with all initial HEP    Time  4    Period  Weeks    Status  New      PT SHORT TERM GOAL #2   Title  He will improve LT elbow flexion and extension to equal RT active    Time  4    Period  Weeks    Status  New      PT SHORT TERM GOAL #3   Title  He will be able to supinate RT wrist    +15 degrees    Time  4    Period  Weeks    Status  New      PT SHORT TERM GOAL #4   Title  Active RT wrist extension to 15 degrees    Time  4    Period  Weeks    Status  New        PT Long Term Goals - 09/07/19 [id number]      PT LONG TERM GOAL #1   Title  He will be independent with all hEP    Time  12    Period  Weeks    Status  New      PT LONG TERM GOAL #2   Title  He will be able to reach overhead with  5#   to access overhead cabinets.    Time  12    Period  Weeks    Status  New      PT LONG TERM GOAL #3   Title  He will be able to open bottles and jars,  and open doors with min to no pain.    Time  12    Period  Weeks    Status  New      PT LONG TERM GOAL #4   Title  He will return to driving and normal work activity    Time  12    Period  Weeks    Status  New      PT LONG TERM GOAL #5   Title  FOTO score will decreased to 44% limited or better to demo percieved functional improvement    Time  12    Period  Weeks    Status  New            Plan - 09/23/19 [id number]    Clinical Impression Statement  He was able to demo HEP correctly.   Able to make complete fist on RT.   Progressin well. Continued stillness.    PT Treatment/Interventions  Taping;Manual techniques;Patient/family education;Therapeutic activities;Therapeutic exercise;Cryotherapy;Moist Heat;Passive range of motion    PT Next Visit Plan  active and passive  ROM , STW   review HEP ( he is modifying)   heat or cold    PT Home Exercise Plan  wrist flex/ext/supination/pronation elbow flex/ext, shoulder flexion /ER, added towel squeeze, Pronation/ Supination with light handle, DIP self assisted flexion and extension.    Consulted and Agree with Plan of Care  Patient       Patient will benefit from skilled therapeutic intervention in order to improve the following deficits and impairments:  Pain, Decreased range of motion, Decreased activity tolerance, Decreased strength, Impaired UE functional use  Visit Diagnosis: Closed fracture of left wrist with routine healing, subsequent encounter  Closed fracture of left elbow with routine healing, subsequent encounter  Pain in right wrist  Pain in left elbow  Stiffness of left elbow, not elsewhere classified  Stiffness of right wrist, not elsewhere classified     Problem List Patient Active Problem List   Diagnosis Date Noted  . Laceration of right hand 07/19/2019  . Fall from balcony 07/16/2019  . Closed fracture of right distal radius and ulna, initial encounter 07/16/2019  . Closed nondisplaced fracture of right clavicle 07/16/2019  .  Liver laceration, closed, initial encounter 07/16/2019  . Multiple rib fractures 07/16/2019    [name]  PT 09/23/2019, 10:42 AM  [facility] [address] [place], [place], [address] Phone: [phone]   Fax:  [phone]  Name: Edward Wall MRN: [id number] Date of Birth: Oct 19, 1986

## 2019-09-27 ENCOUNTER — Ambulatory Visit: Payer: No Typology Code available for payment source

## 2019-09-30 ENCOUNTER — Other Ambulatory Visit: Payer: Self-pay

## 2019-09-30 ENCOUNTER — Ambulatory Visit: Payer: No Typology Code available for payment source | Admitting: Physical Therapy

## 2019-09-30 ENCOUNTER — Encounter: Payer: Self-pay | Admitting: Physical Therapy

## 2019-09-30 DIAGNOSIS — S42402D Unspecified fracture of lower end of left humerus, subsequent encounter for fracture with routine healing: Secondary | ICD-10-CM

## 2019-09-30 DIAGNOSIS — S62102D Fracture of unspecified carpal bone, left wrist, subsequent encounter for fracture with routine healing: Secondary | ICD-10-CM | POA: Diagnosis not present

## 2019-09-30 DIAGNOSIS — M25631 Stiffness of right wrist, not elsewhere classified: Secondary | ICD-10-CM

## 2019-09-30 DIAGNOSIS — M25531 Pain in right wrist: Secondary | ICD-10-CM

## 2019-09-30 DIAGNOSIS — M25622 Stiffness of left elbow, not elsewhere classified: Secondary | ICD-10-CM

## 2019-09-30 NOTE — Therapy (Signed)
[facility] [address] [place], [place], [address] Phone: [phone]   Fax:  [phone]  Physical Therapy Treatment  Patient Details  Name: Edward Wall MRN: [id number] Date of Birth: 02/15/1986 Referring Provider (PT): Ulyses SouthwardSarah Yacobi   PA-C   Encounter Date: 09/30/2019  PT End of Session - 09/30/19 1028    Visit Number  4    Number of Visits  24    Date for PT Re-Evaluation  11/26/19    Authorization Type  self pay    PT Start Time  0930    PT Stop Time  1020    PT Time Calculation (min)  50 min       History reviewed. No pertinent past medical history.  Past Surgical History:  Procedure Laterality Date  . ORIF WRIST FRACTURE Right 07/16/2019   Procedure: OPEN REDUCTION INTERNAL FIXATION (ORIF) WRIST FRACTURE;  Surgeon: Roby LoftsHaddix, Kevin P, MD;  Location: [facility] OR;  Service: Orthopedics;  Laterality: Right;    There were no vitals filed for this visit.  Subjective Assessment - 09/30/19 0931    Subjective  Right wrist At rest 3/10, up to 5/10 with use and therex. Left back of elbow pain with extension.         [facility] 09[facility]30/[facility] 0001      AROM   Left Elbow Flexion  136    Left Elbow Extension  -12    Right Wrist Extension  3 Degrees      PROM   Overall PROM Comments  Left Elbow PROM 0                    [facility] Adult PT Treatment/Exercise - 09/30/19 0001      Elbow Exercises   Forearm Supination  AROM    Forearm Supination Limitations  self assisted ROM, also with handle AROM    Forearm Pronation Limitations  self assisted ROM, also with handle , AROM    Other elbow exercises  Standing tricep over head press AROM x 20     Other elbow exercises  Velcro pad, key turns (circle end) on narrow velcro line (RT hand) , then left hand handle turns on narrow velcro       Hand Exercises   DIPJ Flexion  AROM;Self ROM    DIPJ Extension  AROM;Self ROM    Thumb Opposition  Right hand    x15       Wrist Exercises   Wrist Flexion  AROM;Self ROM;20 reps    Wrist Extension  AROM;Self ROM;20 reps    Wrist Radial Deviation  AROM;Self ROM;Right;Left;20 reps    Bar Weights/Barbell (Radial Deviation)  1 lb   on wooden handle    Other wrist exercises  velcro pad wrist flexion and extension handle turns on narrow velcro, proximal grip on handle, then same with left hand using distal grip on handle     Other wrist exercises  yellow opposing twist bar       Moist Heat Therapy   Number Minutes Moist Heat  10 Minutes    Moist Heat Location  Wrist               PT Short Term Goals - 09/07/19 0935      PT SHORT TERM GOAL #1   Title  He will be indpendent with all initial HEP    Time  4    Period  Weeks    Status  New  PT SHORT TERM GOAL #2   Title  He will improve LT elbow flexion and extension to equal RT active    Time  4    Period  Weeks    Status  New      PT SHORT TERM GOAL #3   Title  He will be able to supinate RT wrist    +15 degrees    Time  4    Period  Weeks    Status  New      PT SHORT TERM GOAL #4   Title  Active RT wrist extension to 15 degrees    Time  4    Period  Weeks    Status  New        PT Long Term Goals - 09/07/19 7253      PT LONG TERM GOAL #1   Title  He will be independent with all hEP    Time  12    Period  Weeks    Status  New      PT LONG TERM GOAL #2   Title  He will be able to reach overhead with  5#   to access overhead cabinets.    Time  12    Period  Weeks    Status  New      PT LONG TERM GOAL #3   Title  He will be able to open bottles and jars,  and open doors with min to no pain.    Time  12    Period  Weeks    Status  New      PT LONG TERM GOAL #4   Title  He will return to driving and normal work activity    Time  12    Period  Weeks    Status  New      PT LONG TERM GOAL #5   Title  FOTO score will decreased to 44% limited or better to demo percieved functional improvement    Time  12    Period   Weeks    Status  New            Plan - 09/30/19 1022    Clinical Impression Statement  Left elbow extension ROm improved. Also, small improvement in right wrist extension. Continued with ROM and AAROM. Began light velcro pad wrist and elbow reistance. Pain 5/10 in right wrist. afterward. HMP at end of session to decrease pain.    PT Next Visit Plan  active and passive  ROM , STW   review HEP ( he is modifying)   heat or cold    PT Home Exercise Plan  wrist flex/ext/supination/pronation elbow flex/ext, shoulder flexion /ER, added towel squeeze, Pronation/ Supination with light handle, DIP self assisted flexion and extension.       Patient will benefit from skilled therapeutic intervention in order to improve the following deficits and impairments:  Pain, Decreased range of motion, Decreased activity tolerance, Decreased strength, Impaired UE functional use  Visit Diagnosis: Closed fracture of left wrist with routine healing, subsequent encounter  Closed fracture of left elbow with routine healing, subsequent encounter  Pain in right wrist  Stiffness of left elbow, not elsewhere classified  Stiffness of right wrist, not elsewhere classified     Problem List Patient Active Problem List   Diagnosis Date Noted  . Laceration of right hand 07/19/2019  . Fall from balcony 07/16/2019  . Closed fracture of right distal radius and ulna,  initial encounter 07/16/2019  . Closed nondisplaced fracture of right clavicle 07/16/2019  . Liver laceration, closed, initial encounter 07/16/2019  . Multiple rib fractures 07/16/2019    Dorene Ar, PTA 09/30/2019, 10:46 AM  [place], [place], [address] Phone: [phone]   Fax:  [phone]  Name: Nycere Presley MRN: [id number] Date of Birth: 09-Nov-1985

## 2019-10-04 ENCOUNTER — Other Ambulatory Visit: Payer: Self-pay

## 2019-10-04 ENCOUNTER — Ambulatory Visit: Payer: No Typology Code available for payment source

## 2019-10-04 DIAGNOSIS — M25531 Pain in right wrist: Secondary | ICD-10-CM

## 2019-10-04 DIAGNOSIS — S42402D Unspecified fracture of lower end of left humerus, subsequent encounter for fracture with routine healing: Secondary | ICD-10-CM

## 2019-10-04 DIAGNOSIS — M25631 Stiffness of right wrist, not elsewhere classified: Secondary | ICD-10-CM

## 2019-10-04 DIAGNOSIS — S62102D Fracture of unspecified carpal bone, left wrist, subsequent encounter for fracture with routine healing: Secondary | ICD-10-CM | POA: Diagnosis not present

## 2019-10-04 NOTE — Therapy (Signed)
[place], [place], [address] Phone: [phone]   Fax:  [phone]  Physical Therapy Treatment  Patient Details  Name: Edward Wall MRN: [id number] Date of Birth: [birth year] Referring Provider (PT): Patrecia Pace   PA-C   Encounter Date: 10/04/2019  PT End of Session - 10/04/19 0917    Visit Number  5    Number of Visits  24    Date for PT Re-Evaluation  11/26/19    Authorization Type  self pay    PT Start Time  0917    PT Stop Time  1000    PT Time Calculation (min)  43 min    Activity Tolerance  Patient tolerated treatment well;Patient limited by pain    Behavior During Therapy  [facility] for tasks assessed/performed       History reviewed. No pertinent past medical history.  Past Surgical History:  Procedure Laterality Date  . ORIF WRIST FRACTURE Right 07/16/2019   Procedure: OPEN REDUCTION INTERNAL FIXATION (ORIF) WRIST FRACTURE;  Surgeon: Shona Needles, MD;  Location: [place];  Service: Orthopedics;  Laterality: Right;    There were no vitals filed for this visit.  Subjective Assessment - 10/04/19 0919    Subjective  Increase ROM Rt wrsit.    3-5/10 pain  Washed sore from washing car yesterday. Feels stiff    Pain Score  6     Pain Location  Wrist    Pain Orientation  Right    Pain Descriptors / Indicators  Aching    Pain Type  Surgical pain         [facility] PT Assessment - 10/04/19 0001      AROM   Left Elbow Flexion  140    Left Elbow Extension  -8    Right Wrist Extension  21 Degrees   35 degrees post stretching   Right Wrist Flexion  28 Degrees   50 degrees post manual   Right Wrist Radial Deviation  5 Degrees    Right Wrist Ulnar Deviation  25 Degrees                   [facility] Adult PT Treatment/Exercise - 10/04/19 0001      Moist Heat Therapy   Number Minutes Moist Heat  10 Minutes    Moist Heat Location  Wrist   R     Manual Therapy   Manual Therapy  Joint  mobilization;Soft tissue mobilization;Passive ROM;Manual Traction    Joint Mobilization  distraction and PA and AP glides to wrist /elbow radial head and fingers. GR2-3    Passive ROM  all planes fingeers and wrist and elbow RT and LT.                PT Education - 10/04/19 0956    Education Details  USe of rolled towel in Lt elbow to help distract/stretch into flexion    Person(s) Educated  Patient    Methods  Explanation;Demonstration;Tactile cues;Verbal cues    Comprehension  Verbalized understanding;Returned demonstration       PT Short Term Goals - 09/07/19 0935      PT SHORT TERM GOAL #1   Title  He will be indpendent with all initial HEP    Time  4    Period  Weeks    Status  New      PT SHORT TERM GOAL #2   Title  He will improve LT elbow flexion and extension to  equal RT active    Time  4    Period  Weeks    Status  New      PT SHORT TERM GOAL #3   Title  He will be able to supinate RT wrist    +15 degrees    Time  4    Period  Weeks    Status  New      PT SHORT TERM GOAL #4   Title  Active RT wrist extension to 15 degrees    Time  4    Period  Weeks    Status  New        PT Long Term Goals - 09/07/19 [id number]      PT LONG TERM GOAL #1   Title  He will be independent with all hEP    Time  12    Period  Weeks    Status  New      PT LONG TERM GOAL #2   Title  He will be able to reach overhead with  5#   to access overhead cabinets.    Time  12    Period  Weeks    Status  New      PT LONG TERM GOAL #3   Title  He will be able to open bottles and jars,  and open doors with min to no pain.    Time  12    Period  Weeks    Status  New      PT LONG TERM GOAL #4   Title  He will return to driving and normal work activity    Time  12    Period  Weeks    Status  New      PT LONG TERM GOAL #5   Title  FOTO score will decreased to 44% limited or better to demo percieved functional improvement    Time  12    Period  Weeks    Status  New             Plan - 10/04/19 0917    Clinical Impression Statement  ROM improveing  almost full ROM LT elbow. Pain continues to limit but he has good tolerance.   Fist ROM RT is WNL and speed WNL   Essentiall spent time with manual and AROM with overpressure    PT Treatment/Interventions  Taping;Manual techniques;Patient/family education;Therapeutic activities;Therapeutic exercise;Cryotherapy;Moist Heat;Passive range of motion    PT Next Visit Plan  active and passive  ROM , STW   review HEP ( he is modifying)   heat or cold    PT Home Exercise Plan  wrist flex/ext/supination/pronation elbow flex/ext, shoulder flexion /ER, added towel squeeze, Pronation/ Supination with light handle, DIP self assisted flexion and extension.    Consulted and Agree with Plan of Care  Patient       Patient will benefit from skilled therapeutic intervention in order to improve the following deficits and impairments:  Pain, Decreased range of motion, Decreased activity tolerance, Decreased strength, Impaired UE functional use  Visit Diagnosis: Closed fracture of left wrist with routine healing, subsequent encounter  Closed fracture of left elbow with routine healing, subsequent encounter  Pain in right wrist  Stiffness of right wrist, not elsewhere classified     Problem List Patient Active Problem List   Diagnosis Date Noted  . Laceration of right hand 07/19/2019  . Fall from balcony 07/16/2019  . Closed fracture of right distal radius and ulna,  initial encounter 07/16/2019  . Closed nondisplaced fracture of right clavicle 07/16/2019  . Liver laceration, closed, initial encounter 07/16/2019  . Multiple rib fractures 07/16/2019    [name]  PT 10/04/2019, 10:00 AM  [facility] [address] [place], [place], [address] Phone: [phone]   Fax:  [phone]  Name: Edward Wall MRN: [id number] Date of Birth: 1986/05/21

## 2019-10-07 ENCOUNTER — Other Ambulatory Visit: Payer: Self-pay

## 2019-10-07 ENCOUNTER — Ambulatory Visit: Payer: No Typology Code available for payment source

## 2019-10-07 DIAGNOSIS — S42402D Unspecified fracture of lower end of left humerus, subsequent encounter for fracture with routine healing: Secondary | ICD-10-CM

## 2019-10-07 DIAGNOSIS — M25531 Pain in right wrist: Secondary | ICD-10-CM

## 2019-10-07 DIAGNOSIS — M25631 Stiffness of right wrist, not elsewhere classified: Secondary | ICD-10-CM

## 2019-10-07 DIAGNOSIS — S62102D Fracture of unspecified carpal bone, left wrist, subsequent encounter for fracture with routine healing: Secondary | ICD-10-CM

## 2019-10-07 DIAGNOSIS — M25622 Stiffness of left elbow, not elsewhere classified: Secondary | ICD-10-CM

## 2019-10-07 DIAGNOSIS — M25522 Pain in left elbow: Secondary | ICD-10-CM

## 2019-10-07 NOTE — Therapy (Signed)
[place], [place], [address] Phone: [phone]   Fax:  [phone]  Physical Therapy Treatment  Patient Details  Name: Edward Wall MRN: [id number] Date of Birth: 1986/05/12 Referring Provider (PT): Patrecia Pace   PA-C   Encounter Date: 10/07/2019  PT End of Session - 10/07/19 0912    Visit Number  6    Number of Visits  24    Date for PT Re-Evaluation  11/26/19    Authorization Type  self pay    PT Start Time  0910    PT Stop Time  1000    PT Time Calculation (min)  50 min    Activity Tolerance  Patient tolerated treatment well;Patient limited by pain    Behavior During Therapy  [facility] for tasks assessed/performed       History reviewed. No pertinent past medical history.  Past Surgical History:  Procedure Laterality Date  . ORIF WRIST FRACTURE Right 07/16/2019   Procedure: OPEN REDUCTION INTERNAL FIXATION (ORIF) WRIST FRACTURE;  Surgeon: Shona Needles, MD;  Location: [place];  Service: Orthopedics;  Laterality: Right;    There were no vitals filed for this visit.  Subjective Assessment - 10/07/19 0958    Subjective  Have tried playing drums for short epriods. Have to sue fingers more than RT wrist now.    Pain Score  5     Pain Location  Wrist    Pain Orientation  Right    Pain Descriptors / Indicators  Aching    Pain Type  Surgical pain    Pain Onset  More than a month ago    Pain Frequency  Constant                       [facility] Adult PT Treatment/Exercise - 10/07/19 0001      Shoulder Exercises: ROM/Strengthening   UBE (Upper Arm Bike)  L1 5 min forward      Hand Exercises   DIPJ Flexion  AROM;Self ROM    DIPJ Extension  AROM;Self ROM      Moist Heat Therapy   Number Minutes Moist Heat  10 Minutes    Moist Heat Location  Wrist      Manual Therapy   Joint Mobilization  distraction and PA and AP glides to wrist /elbow radial head and fingers. GR2-3    Passive ROM   all planes fingeers and wrist and elbow RT and LT.                  PT Short Term Goals - 10/07/19 0916      PT SHORT TERM GOAL #1   Title  He will be indpendent with all initial HEP    Status  Achieved      PT SHORT TERM GOAL #2   Title  He will improve LT elbow flexion and extension to equal RT active    Baseline  LT 140 to-8    Status  On-going      PT SHORT TERM GOAL #3   Title  He will be able to supinate RT wrist    +15 degrees    Status  Achieved        PT Long Term Goals - 09/07/19 0939      PT LONG TERM GOAL #1   Title  He will be independent with all hEP    Time  12    Period  Weeks  Status  New      PT LONG TERM GOAL #2   Title  He will be able to reach overhead with  5#   to access overhead cabinets.    Time  12    Period  Weeks    Status  New      PT LONG TERM GOAL #3   Title  He will be able to open bottles and jars,  and open doors with min to no pain.    Time  12    Period  Weeks    Status  New      PT LONG TERM GOAL #4   Title  He will return to driving and normal work activity    Time  12    Period  Weeks    Status  New      PT LONG TERM GOAL #5   Title  FOTO score will decreased to 44% limited or better to demo percieved functional improvement    Time  12    Period  Weeks    Status  New            Plan - 10/07/19 0915    Clinical Impression Statement  Still with end  range pain LT elbow flexion and extension and wrist supination. Elbow free motion on RT  He reported was able to play with some modification drums agian for short periods. Wrist devicaiton radially limits wrist movement with playing.    PT Treatment/Interventions  Taping;Manual techniques;Patient/family education;Therapeutic activities;Therapeutic exercise;Cryotherapy;Moist Heat;Passive range of motion    PT Next Visit Plan  active and passive  ROM mobs.  , STW   review HEP ( he is modifying)   heat    PT Home Exercise Plan  wrist flex/ext/supination/pronation  elbow flex/ext, shoulder flexion /ER, added towel squeeze, Pronation/ Supination with light handle, DIP self assisted flexion and extension.    Consulted and Agree with Plan of Care  Patient       Patient will benefit from skilled therapeutic intervention in order to improve the following deficits and impairments:  Pain, Decreased range of motion, Decreased activity tolerance, Decreased strength, Impaired UE functional use  Visit Diagnosis: Closed fracture of left wrist with routine healing, subsequent encounter  Closed fracture of left elbow with routine healing, subsequent encounter  Pain in right wrist  Stiffness of right wrist, not elsewhere classified  Stiffness of left elbow, not elsewhere classified  Pain in left elbow     Problem List Patient Active Problem List   Diagnosis Date Noted  . Laceration of right hand 07/19/2019  . Fall from balcony 07/16/2019  . Closed fracture of right distal radius and ulna, initial encounter 07/16/2019  . Closed nondisplaced fracture of right clavicle 07/16/2019  . Liver laceration, closed, initial encounter 07/16/2019  . Multiple rib fractures 07/16/2019    [name]  PT 10/07/2019, 9:59 AM  [facility] [address] [place], [place], [address] Phone: [phone]   Fax:  [phone]  Name: Edward Wall MRN: [id number] Date of Birth: 01/28/1986

## 2019-10-11 ENCOUNTER — Other Ambulatory Visit: Payer: Self-pay

## 2019-10-11 ENCOUNTER — Ambulatory Visit: Payer: No Typology Code available for payment source

## 2019-10-11 DIAGNOSIS — M25631 Stiffness of right wrist, not elsewhere classified: Secondary | ICD-10-CM

## 2019-10-11 DIAGNOSIS — S42402D Unspecified fracture of lower end of left humerus, subsequent encounter for fracture with routine healing: Secondary | ICD-10-CM

## 2019-10-11 DIAGNOSIS — M25522 Pain in left elbow: Secondary | ICD-10-CM

## 2019-10-11 DIAGNOSIS — M25531 Pain in right wrist: Secondary | ICD-10-CM

## 2019-10-11 DIAGNOSIS — S62102D Fracture of unspecified carpal bone, left wrist, subsequent encounter for fracture with routine healing: Secondary | ICD-10-CM

## 2019-10-11 DIAGNOSIS — M25622 Stiffness of left elbow, not elsewhere classified: Secondary | ICD-10-CM

## 2019-10-11 NOTE — Therapy (Signed)
[facility] Outpatient [facility] [address] [place], [place], [address] Phone: [phone]   Fax:  [phone]  Physical Therapy Treatment  Patient Details  Name: Edward Wall MRN: [id number] Date of Birth: 24-Mar-1986 Referring Provider (PT): Ulyses Southward   PA-C   Encounter Date: 10/11/2019  PT End of Session - 10/11/19 0914    Visit Number  7    Number of Visits  24    Date for PT Re-Evaluation  11/26/19    Authorization Type  self pay    PT Start Time  0915    PT Stop Time  1008    PT Time Calculation (min)  53 min    Activity Tolerance  Patient tolerated treatment well;Patient limited by pain    Behavior During Therapy  [facility] for tasks assessed/performed       History reviewed. No pertinent past medical history.  Past Surgical History:  Procedure Laterality Date  . ORIF WRIST FRACTURE Right 07/16/2019   Procedure: OPEN REDUCTION INTERNAL FIXATION (ORIF) WRIST FRACTURE;  Surgeon: Roby Lofts, MD;  [facility];  Service: Orthopedics;  Laterality: Right;    There were no vitals filed for this visit.  Subjective Assessment - 10/11/19 0920    Subjective  2/10 pain RT wrist    Pain Score  2     Pain Location  Wrist    Pain Orientation  Right    Pain Descriptors / Indicators  Aching    Pain Type  Surgical pain    Pain Onset  More than a month ago    Pain Frequency  Constant    Aggravating Factors   using RT hand    Pain Relieving Factors  meds , rest.                       [facility] Adult PT Treatment/Exercise - 10/11/19 0001      Shoulder Exercises: ROM/Strengthening   UBE (Upper Arm Bike)  L2 5 min forward      Hand Exercises   Other Hand Exercises  yellow putty gripping/supineation    roling into thinner tube,  RT and Lt hand  issued for home      Moist Heat Therapy   Number Minutes Moist Heat  10 Minutes    Moist Heat Location  Wrist      Manual Therapy   Joint Mobilization  distraction and PA and AP  glides to wrist /elbow radial head and fingers. GR2-3    Passive ROM  all planes fingeers and wrist and elbow RT and LT.          Pt to use putty daily x 15 min as able   No hand out issued but demo options and he demo understanding  Of using putty. LT and RT hand          PT Short Term Goals - 10/07/19 0916      PT SHORT TERM GOAL #1   Title  He will be indpendent with all initial HEP    Status  Achieved      PT SHORT TERM GOAL #2   Title  He will improve LT elbow flexion and extension to equal RT active    Baseline  LT 140 to-8    Status  On-going      PT SHORT TERM GOAL #3   Title  He will be able to supinate RT wrist    +15 degrees    Status  Achieved        PT Long Term Goals - 09/07/19 0939      PT LONG TERM GOAL #1   Title  He will be independent with all hEP    Time  12    Period  Weeks    Status  New      PT LONG TERM GOAL #2   Title  He will be able to reach overhead with  5#   to access overhead cabinets.    Time  12    Period  Weeks    Status  New      PT LONG TERM GOAL #3   Title  He will be able to open bottles and jars,  and open doors with min to no pain.    Time  12    Period  Weeks    Status  New      PT LONG TERM GOAL #4   Title  He will return to driving and normal work activity    Time  12    Period  Weeks    Status  New      PT LONG TERM GOAL #5   Title  FOTO score will decreased to 44% limited or better to demo percieved functional improvement    Time  12    Period  Weeks    Status  New            Plan - 10/11/19 0915    Clinical Impression Statement  Still stiff wrist all planes  but tolerating more aggreessive stretching.  Pain with stretching but eases post with heat. Added putty for hand strength and ROm.    Progressing slowly    PT Treatment/Interventions  Taping;Manual techniques;Patient/family education;Therapeutic activities;Therapeutic exercise;Cryotherapy;Moist Heat;Passive range of motion    PT Next Visit Plan   active and passive  ROM mobs.  , STW   review HEP ( he is modifying)   heat    PT Home Exercise Plan  wrist flex/ext/supination/pronation elbow flex/ext, shoulder flexion /ER, added towel squeeze, Pronation/ Supination with light handle, DIP self assisted flexion and extension.    Consulted and Agree with Plan of Care  Patient       Patient will benefit from skilled therapeutic intervention in order to improve the following deficits and impairments:  Pain, Decreased range of motion, Decreased activity tolerance, Decreased strength, Impaired UE functional use  Visit Diagnosis: Closed fracture of left wrist with routine healing, subsequent encounter  Closed fracture of left elbow with routine healing, subsequent encounter  Pain in right wrist  Stiffness of right wrist, not elsewhere classified  Stiffness of left elbow, not elsewhere classified  Pain in left elbow     Problem List Patient Active Problem List   Diagnosis Date Noted  . Laceration of right hand 07/19/2019  . Fall from balcony 07/16/2019  . Closed fracture of right distal radius and ulna, initial encounter 07/16/2019  . Closed nondisplaced fracture of right clavicle 07/16/2019  . Liver laceration, closed, initial encounter 07/16/2019  . Multiple rib fractures 07/16/2019    [name] PT 10/11/2019, 9:54 AM  [facility] [address] [place], [place], [address] Phone: [phone]   Fax:  [phone]  Name: Baxter Gonzalez MRN: [id number] Date of Birth: 1985-12-01

## 2019-10-14 ENCOUNTER — Ambulatory Visit: Payer: No Typology Code available for payment source

## 2019-10-14 ENCOUNTER — Telehealth: Payer: Self-pay | Admitting: Physical Therapy

## 2019-10-18 ENCOUNTER — Ambulatory Visit: Payer: No Typology Code available for payment source | Admitting: Physical Therapy

## 2019-10-18 ENCOUNTER — Other Ambulatory Visit: Payer: Self-pay

## 2019-10-18 ENCOUNTER — Encounter: Payer: Self-pay | Admitting: Physical Therapy

## 2019-10-18 DIAGNOSIS — M25531 Pain in right wrist: Secondary | ICD-10-CM

## 2019-10-18 DIAGNOSIS — S62102D Fracture of unspecified carpal bone, left wrist, subsequent encounter for fracture with routine healing: Secondary | ICD-10-CM | POA: Diagnosis not present

## 2019-10-18 DIAGNOSIS — S42402D Unspecified fracture of lower end of left humerus, subsequent encounter for fracture with routine healing: Secondary | ICD-10-CM

## 2019-10-18 DIAGNOSIS — M25622 Stiffness of left elbow, not elsewhere classified: Secondary | ICD-10-CM

## 2019-10-18 DIAGNOSIS — M25522 Pain in left elbow: Secondary | ICD-10-CM

## 2019-10-18 DIAGNOSIS — M25631 Stiffness of right wrist, not elsewhere classified: Secondary | ICD-10-CM

## 2019-10-18 NOTE — Therapy (Signed)
[place], [place], [address] Phone: [phone]   Fax:  [phone]  Physical Therapy Treatment  Patient Details  Name: Edward Wall MRN: [id number] Date of Birth: April 18, 1986 Referring Provider (PT): Patrecia Pace   PA-C   Encounter Date: 10/18/2019  PT End of Session - 10/18/19 1234    Visit Number  8    Number of Visits  24    Date for PT Re-Evaluation  11/26/19    Authorization Type  self pay    PT Start Time  1232    PT Stop Time  1316    PT Time Calculation (min)  44 min       History reviewed. No pertinent past medical history.  Past Surgical History:  Procedure Laterality Date  . ORIF WRIST FRACTURE Right 07/16/2019   Procedure: OPEN REDUCTION INTERNAL FIXATION (ORIF) WRIST FRACTURE;  Surgeon: Shona Needles, MD;  Location: [facility];  Service: Orthopedics;  Laterality: Right;    There were no vitals filed for this visit.  Subjective Assessment - 10/18/19 1236    Subjective  Wrists are slightly better.  I have noticed I have numbness in thumb and first finger on right and I am aching more where my hardware.                       [facility] Adult PT Treatment/Exercise - 10/18/19 0001      Elbow Exercises   Other elbow exercises  over head tricep press 3#       Shoulder Exercises: ROM/Strengthening   UBE (Upper Arm Bike)  L2 , 2 min forward and 2 min backward      Wrist Exercises   Wrist Flexion  20 reps    Bar Weights/Barbell (Wrist Flexion)  1 lb    Wrist Extension  20 reps    Bar Weights/Barbell (Wrist Extension)  1 lb    Other wrist exercises  velcro pad wrist flexion and extension handle turns on narrow velcro, proximal grip on handle, then same with left hand using distal grip on handle     Other wrist exercises  yellow opposing twist bar       Moist Heat Therapy   Number Minutes Moist Heat  10 Minutes    Moist Heat Location  Wrist      Manual Therapy   Joint  Mobilization  distraction and PA and AP glides to wrist /elbow radial head and fingers. GR2-3    Passive ROM  all planes fingeers and wrist and elbow RT and LT.                  PT Short Term Goals - 10/07/19 0916      PT SHORT TERM GOAL #1   Title  He will be indpendent with all initial HEP    Status  Achieved      PT SHORT TERM GOAL #2   Title  He will improve LT elbow flexion and extension to equal RT active    Baseline  LT 140 to-8    Status  On-going      PT SHORT TERM GOAL #3   Title  He will be able to supinate RT wrist    +15 degrees    Status  Achieved        PT Long Term Goals - 09/07/19 0939      PT LONG TERM GOAL #1   Title  He will  be independent with all hEP    Time  12    Period  Weeks    Status  New      PT LONG TERM GOAL #2   Title  He will be able to reach overhead with  5#   to access overhead cabinets.    Time  12    Period  Weeks    Status  New      PT LONG TERM GOAL #3   Title  He will be able to open bottles and jars,  and open doors with min to no pain.    Time  12    Period  Weeks    Status  New      PT LONG TERM GOAL #4   Title  He will return to driving and normal work activity    Time  12    Period  Weeks    Status  New      PT LONG TERM GOAL #5   Title  FOTO score will decreased to 44% limited or better to demo percieved functional improvement    Time  12    Period  Weeks    Status  New            Plan - 10/18/19 1320    Clinical Impression Statement  Pt reports he is driing short distances and carry light grocerry bags. Right arm is limited with washing under left arm. Continued with PROM and AAROM and light strengthening. He tolerated al well with increased pain at end ranges.    PT Next Visit Plan  active and passive  ROM mobs.  , STW   review HEP ( he is modifying)   heat    PT Home Exercise Plan  wrist flex/ext/supination/pronation elbow flex/ext, shoulder flexion /ER, added towel squeeze, Pronation/ Supination  with light handle, DIP self assisted flexion and extension.       Patient will benefit from skilled therapeutic intervention in order to improve the following deficits and impairments:  Pain, Decreased range of motion, Decreased activity tolerance, Decreased strength, Impaired UE functional use  Visit Diagnosis: Closed fracture of left wrist with routine healing, subsequent encounter  Closed fracture of left elbow with routine healing, subsequent encounter  Pain in right wrist  Stiffness of right wrist, not elsewhere classified  Stiffness of left elbow, not elsewhere classified  Pain in left elbow     Problem List Patient Active Problem List   Diagnosis Date Noted  . Laceration of right hand 07/19/2019  . Fall from balcony 07/16/2019  . Closed fracture of right distal radius and ulna, initial encounter 07/16/2019  . Closed nondisplaced fracture of right clavicle 07/16/2019  . Liver laceration, closed, initial encounter 07/16/2019  . Multiple rib fractures 07/16/2019    Sherrie Mustache, PTA 10/18/2019, 1:22 PM  [facility] [address] [place], [place], [address] Phone: [phone]   Fax:  [phone]  Name: Square Jowett MRN: [id number] Date of Birth: [birth year]

## 2019-10-21 ENCOUNTER — Encounter: Payer: Self-pay | Admitting: Physical Therapy

## 2019-10-21 ENCOUNTER — Ambulatory Visit: Payer: No Typology Code available for payment source | Admitting: Physical Therapy

## 2019-10-21 ENCOUNTER — Other Ambulatory Visit: Payer: Self-pay

## 2019-10-21 DIAGNOSIS — S62102D Fracture of unspecified carpal bone, left wrist, subsequent encounter for fracture with routine healing: Secondary | ICD-10-CM

## 2019-10-21 DIAGNOSIS — M25622 Stiffness of left elbow, not elsewhere classified: Secondary | ICD-10-CM

## 2019-10-21 DIAGNOSIS — M25522 Pain in left elbow: Secondary | ICD-10-CM

## 2019-10-21 DIAGNOSIS — S42402D Unspecified fracture of lower end of left humerus, subsequent encounter for fracture with routine healing: Secondary | ICD-10-CM

## 2019-10-21 DIAGNOSIS — M25631 Stiffness of right wrist, not elsewhere classified: Secondary | ICD-10-CM

## 2019-10-21 DIAGNOSIS — M25531 Pain in right wrist: Secondary | ICD-10-CM

## 2019-10-21 NOTE — Therapy (Signed)
[place], [place], [address] Phone: [phone]   Fax:  [phone]  Physical Therapy Treatment  Patient Details  Name: Edward Wall MRN: [id number] Date of Birth: 21-Jun-1986 Referring Provider (PT): Patrecia Pace   PA-C   Encounter Date: 10/21/2019  PT End of Session - 10/21/19 1150    Visit Number  9    Number of Visits  24    Date for PT Re-Evaluation  11/26/19    Authorization Type  self pay    PT Start Time  9323    PT Stop Time  1240    PT Time Calculation (min)  55 min       History reviewed. No pertinent past medical history.  Past Surgical History:  Procedure Laterality Date  . ORIF WRIST FRACTURE Right 07/16/2019   Procedure: OPEN REDUCTION INTERNAL FIXATION (ORIF) WRIST FRACTURE;  Surgeon: Shona Needles, MD;  Location: [place];  Service: Orthopedics;  Laterality: Right;    There were no vitals filed for this visit.  Subjective Assessment - 10/21/19 1149    Subjective  About the same.    Currently in Pain?  Yes    Pain Score  3     Pain Location  Wrist    Pain Orientation  Right    Pain Descriptors / Indicators  Aching    Aggravating Factors   using right hand, lifting gripping    Pain Relieving Factors  meds rest, heat ice         [facility] PT Assessment - 10/21/19 0001      Observation/Other Assessments   Focus on Therapeutic Outcomes (FOTO)   64% limited                    [facility] Adult PT Treatment/Exercise - 10/21/19 0001      Elbow Exercises   Other elbow exercises  over head tricep press 3#       Shoulder Exercises: ROM/Strengthening   UBE (Upper Arm Bike)  L2 5 minutes half eay way       Wrist Exercises   Wrist Flexion  20 reps    Bar Weights/Barbell (Wrist Flexion)  1 lb    Wrist Extension  20 reps    Bar Weights/Barbell (Wrist Extension)  1 lb    Other wrist exercises  velcro pad wrist flexion and extension handle turns on narrow velcro, proximal  grip on handle, then same with left hand using distal grip on handle       Moist Heat Therapy   Number Minutes Moist Heat  10 Minutes    Moist Heat Location  Wrist      Manual Therapy   Joint Mobilization  distraction and PA and AP glides to wrist /elbow radial head and fingers. GR2-3    Passive ROM  all planes fingeers and wrist and elbow RT and LT.                  PT Short Term Goals - 10/07/19 0916      PT SHORT TERM GOAL #1   Title  He will be indpendent with all initial HEP    Status  Achieved      PT SHORT TERM GOAL #2   Title  He will improve LT elbow flexion and extension to equal RT active    Baseline  LT 140 to-8    Status  On-going      PT SHORT TERM GOAL #  3   Title  He will be able to supinate RT wrist    +15 degrees    Status  Achieved        PT Long Term Goals - 09/07/19 0939      PT LONG TERM GOAL #1   Title  He will be independent with all hEP    Time  12    Period  Weeks    Status  New      PT LONG TERM GOAL #2   Title  He will be able to reach overhead with  5#   to access overhead cabinets.    Time  12    Period  Weeks    Status  New      PT LONG TERM GOAL #3   Title  He will be able to open bottles and jars,  and open doors with min to no pain.    Time  12    Period  Weeks    Status  New      PT LONG TERM GOAL #4   Title  He will return to driving and normal work activity    Time  12    Period  Weeks    Status  New      PT LONG TERM GOAL #5   Title  FOTO score will decreased to 44% limited or better to demo percieved functional improvement    Time  12    Period  Weeks    Status  New            Plan - 10/21/19 1239    Clinical Impression Statement  Focued PROM bilat wrist and elbows, right digits. Increased pain with end ranges on right. FOTO score improved    PT Next Visit Plan  active and passive  ROM mobs.  , STW   review HEP ( he is modifying)   heat    PT Home Exercise Plan  wrist flex/ext/supination/pronation  elbow flex/ext, shoulder flexion /ER, added towel squeeze, Pronation/ Supination with light handle, DIP self assisted flexion and extension.       Patient will benefit from skilled therapeutic intervention in order to improve the following deficits and impairments:  Pain, Decreased range of motion, Decreased activity tolerance, Decreased strength, Impaired UE functional use  Visit Diagnosis: Closed fracture of left wrist with routine healing, subsequent encounter  Closed fracture of left elbow with routine healing, subsequent encounter  Pain in right wrist  Stiffness of right wrist, not elsewhere classified  Stiffness of left elbow, not elsewhere classified  Pain in left elbow     Problem List Patient Active Problem List   Diagnosis Date Noted  . Laceration of right hand 07/19/2019  . Fall from balcony 07/16/2019  . Closed fracture of right distal radius and ulna, initial encounter 07/16/2019  . Closed nondisplaced fracture of right clavicle 07/16/2019  . Liver laceration, closed, initial encounter 07/16/2019  . Multiple rib fractures 07/16/2019    Sherrie Mustache, PTA 10/21/2019, 12:46 PM  [facility] [address] [place], [place], [address] Phone: [phone]   Fax:  [phone]  Name: Edward Wall MRN: [id number] Date of Birth: March 22, 1986

## 2019-10-25 ENCOUNTER — Ambulatory Visit: Payer: No Typology Code available for payment source | Attending: Student | Admitting: Physical Therapy

## 2019-10-25 DIAGNOSIS — S42402D Unspecified fracture of lower end of left humerus, subsequent encounter for fracture with routine healing: Secondary | ICD-10-CM | POA: Insufficient documentation

## 2019-10-25 DIAGNOSIS — M25531 Pain in right wrist: Secondary | ICD-10-CM | POA: Insufficient documentation

## 2019-10-25 DIAGNOSIS — M25522 Pain in left elbow: Secondary | ICD-10-CM | POA: Insufficient documentation

## 2019-10-25 DIAGNOSIS — S62102D Fracture of unspecified carpal bone, left wrist, subsequent encounter for fracture with routine healing: Secondary | ICD-10-CM | POA: Insufficient documentation

## 2019-10-25 DIAGNOSIS — M25631 Stiffness of right wrist, not elsewhere classified: Secondary | ICD-10-CM | POA: Insufficient documentation

## 2019-10-25 DIAGNOSIS — M25622 Stiffness of left elbow, not elsewhere classified: Secondary | ICD-10-CM | POA: Insufficient documentation

## 2019-10-27 ENCOUNTER — Telehealth: Payer: Self-pay | Admitting: Physical Therapy

## 2019-10-27 ENCOUNTER — Ambulatory Visit: Payer: No Typology Code available for payment source | Admitting: Physical Therapy

## 2019-10-27 NOTE — Telephone Encounter (Signed)
Spoke to patient regarding no-show. He reports he tried to call and cancel because he had a schedule conflict. He thought it was his last appointment scheduled. He was reminded of his future appointments and he plans to attend.

## 2019-11-01 ENCOUNTER — Ambulatory Visit: Payer: No Typology Code available for payment source

## 2019-11-01 ENCOUNTER — Other Ambulatory Visit: Payer: Self-pay

## 2019-11-01 DIAGNOSIS — S62102D Fracture of unspecified carpal bone, left wrist, subsequent encounter for fracture with routine healing: Secondary | ICD-10-CM

## 2019-11-01 DIAGNOSIS — M25531 Pain in right wrist: Secondary | ICD-10-CM

## 2019-11-01 DIAGNOSIS — M25622 Stiffness of left elbow, not elsewhere classified: Secondary | ICD-10-CM | POA: Diagnosis present

## 2019-11-01 DIAGNOSIS — S42402D Unspecified fracture of lower end of left humerus, subsequent encounter for fracture with routine healing: Secondary | ICD-10-CM

## 2019-11-01 DIAGNOSIS — M25631 Stiffness of right wrist, not elsewhere classified: Secondary | ICD-10-CM

## 2019-11-01 DIAGNOSIS — M25522 Pain in left elbow: Secondary | ICD-10-CM | POA: Diagnosis present

## 2019-11-01 NOTE — Therapy (Signed)
[facility] Rehabilitation [facility] [address] [place], [place], [address] Phone: [phone]   Fax:  [phone]  Physical Therapy Treatment  Patient Details  Name: Edward Wall MRN: [id number] Date of Birth: 1985/11/27 Referring Provider (PT): Ulyses Southward   PA-C   Encounter Date: 11/01/2019  PT End of Session - 11/01/19 0917    Visit Number  10    Number of Visits  24    Date for PT Re-Evaluation  11/26/19    Authorization Type  self pay    PT Start Time  0915    PT Stop Time  1010    PT Time Calculation (min)  55 min    Activity Tolerance  Patient tolerated treatment well;Patient limited by pain    Behavior During Therapy  [facility] for tasks assessed/performed       No past medical history on file.  Past Surgical History:  Procedure Laterality Date  . ORIF WRIST FRACTURE Right 07/16/2019   Procedure: OPEN REDUCTION INTERNAL FIXATION (ORIF) WRIST FRACTURE;  Surgeon: Roby Lofts, MD;  [facility];  Service: Orthopedics;  Laterality: Right;    There were no vitals filed for this visit.  Subjective Assessment - 11/01/19 1001    Subjective  About the same.  MD said he wa in brace for a long period so expected stiffness    Pain Score  3     Pain Location  Wrist    Pain Orientation  Right    Pain Descriptors / Indicators  Aching    Pain Type  Surgical pain    Pain Frequency  Constant    Aggravating Factors   using RT arm , gripping, lifting    Pain Relieving Factors  heat , rest         [facility] PT Assessment - 11/01/19 0001      AROM   Right Wrist Extension  35 Degrees    Right Wrist Flexion  35 Degrees    Right Wrist Radial Deviation  12 Degrees    Right Wrist Ulnar Deviation  30 Degrees                   [facility] Adult PT Treatment/Exercise - 11/01/19 0001      Elbow Exercises   Other elbow exercises  over head tricep press 4#  x 15 RT/LT       Shoulder Exercises: ROM/Strengthening   UBE (Upper Arm Bike)  L3 5  minutes half eay way       Wrist Exercises   Wrist Flexion  15 reps    Bar Weights/Barbell (Wrist Flexion)  3 lbs    Wrist Extension  Right;15 reps    Bar Weights/Barbell (Wrist Extension)  3 lbs    Wrist Radial Deviation  Right;15 reps    Bar Weights/Barbell (Radial Deviation)  3 lbs    Other wrist exercises  velcro pad wrist flexion /extension/supination and pronation handle turns on narrow velcro  Lt and RT      Moist Heat Therapy   Number Minutes Moist Heat  10 Minutes    Moist Heat Location  Wrist      Manual Therapy   Joint Mobilization  distraction and PA and AP glides to wrist /elbow radial head and fingers. GR2-3    Passive ROM  all planes fingeers and wrist and elbow RT and LT.                  PT Short Term  Goals - 10/07/19 0916      PT SHORT TERM GOAL #1   Title  He will be indpendent with all initial HEP    Status  Achieved      PT SHORT TERM GOAL #2   Title  He will improve LT elbow flexion and extension to equal RT active    Baseline  LT 140 to-8    Status  On-going      PT SHORT TERM GOAL #3   Title  He will be able to supinate RT wrist    +15 degrees    Status  Achieved        PT Long Term Goals - 09/07/19 0939      PT LONG TERM GOAL #1   Title  He will be independent with all hEP    Time  12    Period  Weeks    Status  New      PT LONG TERM GOAL #2   Title  He will be able to reach overhead with  5#   to access overhead cabinets.    Time  12    Period  Weeks    Status  New      PT LONG TERM GOAL #3   Title  He will be able to open bottles and jars,  and open doors with min to no pain.    Time  12    Period  Weeks    Status  New      PT LONG TERM GOAL #4   Title  He will return to driving and normal work activity    Time  12    Period  Weeks    Status  New      PT LONG TERM GOAL #5   Title  FOTO score will decreased to 44% limited or better to demo percieved functional improvement    Time  12    Period  Weeks    Status  New             Plan - 11/01/19 7829    Clinical Impression Statement  Still stiff and sore. MD said healing was good.  ROM improved    PT Treatment/Interventions  Taping;Manual techniques;Patient/family education;Therapeutic activities;Therapeutic exercise;Cryotherapy;Moist Heat;Passive range of motion    PT Next Visit Plan  active and passive  ROM mobs.  , STW   review HEP ( he is modifying)   heat                   FOTO    PT Home Exercise Plan  wrist flex/ext/supination/pronation elbow flex/ext, shoulder flexion /ER, added towel squeeze, Pronation/ Supination with light handle, DIP self assisted flexion and extension.    Consulted and Agree with Plan of Care  Patient       Patient will benefit from skilled therapeutic intervention in order to improve the following deficits and impairments:  Pain, Decreased range of motion, Decreased activity tolerance, Decreased strength, Impaired UE functional use  Visit Diagnosis: Closed fracture of left wrist with routine healing, subsequent encounter  Closed fracture of left elbow with routine healing, subsequent encounter  Pain in right wrist  Stiffness of right wrist, not elsewhere classified     Problem List Patient Active Problem List   Diagnosis Date Noted  . Laceration of right hand 07/19/2019  . Fall from balcony 07/16/2019  . Closed fracture of right distal radius and ulna, initial encounter 07/16/2019  . Closed nondisplaced  fracture of right clavicle 07/16/2019  . Liver laceration, closed, initial encounter 07/16/2019  . Multiple rib fractures 07/16/2019    [name]  PT 11/01/2019, 10:03 AM  [name], [place], [address] Phone: [phone]   Fax:  [phone]  Name: Edward Wall MRN: [id number] Date of Birth: 03/13/1986

## 2019-11-04 ENCOUNTER — Other Ambulatory Visit: Payer: Self-pay

## 2019-11-04 ENCOUNTER — Ambulatory Visit: Payer: No Typology Code available for payment source

## 2019-11-04 DIAGNOSIS — M25631 Stiffness of right wrist, not elsewhere classified: Secondary | ICD-10-CM

## 2019-11-04 DIAGNOSIS — S62102D Fracture of unspecified carpal bone, left wrist, subsequent encounter for fracture with routine healing: Secondary | ICD-10-CM

## 2019-11-04 DIAGNOSIS — S42402D Unspecified fracture of lower end of left humerus, subsequent encounter for fracture with routine healing: Secondary | ICD-10-CM | POA: Diagnosis not present

## 2019-11-04 DIAGNOSIS — M25531 Pain in right wrist: Secondary | ICD-10-CM

## 2019-11-04 NOTE — Therapy (Signed)
[facility] [address] [place], [place], [address] Phone: [phone]   Fax:  [phone]  Physical Therapy Treatment  Patient Details  Name: Edward Wall MRN: [id number] Date of Birth: Nov 01, 1985 Referring Provider (PT): Ulyses Southward   PA-C   Encounter Date: 11/04/2019  PT End of Session - 11/04/19 0918    Visit Number  11    Number of Visits  24    Date for PT Re-Evaluation  11/26/19    Authorization Type  self pay    PT Start Time  0912    PT Stop Time  0954    PT Time Calculation (min)  42 min    Activity Tolerance  Patient tolerated treatment well;Patient limited by pain    Behavior During Therapy  [facility] for tasks assessed/performed       No past medical history on file.  Past Surgical History:  Procedure Laterality Date  . ORIF WRIST FRACTURE Right 07/16/2019   Procedure: OPEN REDUCTION INTERNAL FIXATION (ORIF) WRIST FRACTURE;  Surgeon: Roby Lofts, MD;  Location: [facility] OR;  Service: Orthopedics;  Laterality: Right;    There were no vitals filed for this visit.  Subjective Assessment - 11/04/19 1000    Subjective  No probelms , Can tell its getting some loosetr.   Can''t start care with RT hand . Assist with LT    Pain Score  2     Pain Location  Wrist    Pain Orientation  Right    Pain Descriptors / Indicators  Aching    Pain Type  Surgical pain    Pain Onset  More than a month ago    Pain Frequency  Constant         [facility] PT Assessment - 11/04/19 0001      AROM   Overall AROM Comments  RT supination 25 degrees  pronation 50    Right Wrist Extension  43 Degrees    Right Wrist Flexion  40 Degrees      PROM   PROM Assessment Site  Wrist;Forearm    Right/Left Forearm  Right    Right Forearm Pronation  60 Degrees    Right Forearm Supination  30 Degrees    Right/Left Wrist  Right    Right Wrist Extension  50 Degrees    Right Wrist Flexion  53 Degrees                   [facility] Adult PT  Treatment/Exercise - 11/04/19 0001      Shoulder Exercises: ROM/Strengthening   UBE (Upper Arm Bike)  L4  8 minutes half eay way       Wrist Exercises   Wrist Flexion  20 reps;Right    Bar Weights/Barbell (Wrist Flexion)  3 lbs    Wrist Extension  Right;20 reps    Bar Weights/Barbell (Wrist Extension)  3 lbs    Wrist Radial Deviation  Right;20 reps    Bar Weights/Barbell (Radial Deviation)  3 lbs    Other wrist exercises  velcro pad wrist flexion /extension/supination and pronation handle turns on narrow velcro  Lt and RT      Manual Therapy   Joint Mobilization  distraction and PA and AP glides to wrist /elbow radial head and fingers. GR2-3    Passive ROM  all planes fingeers and wrist and elbow RT and LT.                  PT  Short Term Goals - 11/04/19 1001      PT SHORT TERM GOAL #1   Title  He will be indpendent with all initial HEP    Status  Achieved      PT SHORT TERM GOAL #3   Title  He will be able to supinate RT wrist    +15 degrees    Status  Achieved      PT SHORT TERM GOAL #4   Title  Active RT wrist extension to 15 degrees    Status  Achieved        PT Long Term Goals - 09/07/19 0939      PT LONG TERM GOAL #1   Title  He will be independent with all hEP    Time  12    Period  Weeks    Status  New      PT LONG TERM GOAL #2   Title  He will be able to reach overhead with  5#   to access overhead cabinets.    Time  12    Period  Weeks    Status  New      PT LONG TERM GOAL #3   Title  He will be able to open bottles and jars,  and open doors with min to no pain.    Time  12    Period  Weeks    Status  New      PT LONG TERM GOAL #4   Title  He will return to driving and normal work activity    Time  12    Period  Weeks    Status  New      PT LONG TERM GOAL #5   Title  FOTO score will decreased to 44% limited or better to demo percieved functional improvement    Time  12    Period  Weeks    Status  New            Plan -  11/04/19 5409    Clinical Impression Statement  ROM improved again today. More pain with wrist pronation even though this is more loose than supination. Still some spring with ROm. declined heat today    PT Treatment/Interventions  Taping;Manual techniques;Patient/family education;Therapeutic activities;Therapeutic exercise;Cryotherapy;Moist Heat;Passive range of motion    PT Next Visit Plan  active and passive  ROM mobs.  , STW   review HEP ( he is modifying)   heat                   FOTO    PT Home Exercise Plan  wrist flex/ext/supination/pronation elbow flex/ext, shoulder flexion /ER, added towel squeeze, Pronation/ Supination with light handle, DIP self assisted flexion and extension.    Consulted and Agree with Plan of Care  Patient       Patient will benefit from skilled therapeutic intervention in order to improve the following deficits and impairments:  Pain, Decreased range of motion, Decreased activity tolerance, Decreased strength, Impaired UE functional use  Visit Diagnosis: Closed fracture of left elbow with routine healing, subsequent encounter  Closed fracture of left wrist with routine healing, subsequent encounter  Pain in right wrist  Stiffness of right wrist, not elsewhere classified     Problem List Patient Active Problem List   Diagnosis Date Noted  . Laceration of right hand 07/19/2019  . Fall from balcony 07/16/2019  . Closed fracture of right distal radius and ulna, initial encounter 07/16/2019  .  Closed nondisplaced fracture of right clavicle 07/16/2019  . Liver laceration, closed, initial encounter 07/16/2019  . Multiple rib fractures 07/16/2019    [name]  PT 11/04/2019, 10:03 AM  [facility] [address] [place], [place], [address] Phone: [phone]   Fax:  [phone]  Name: Edward Wall MRN: [id number] Date of Birth: 1986/06/19

## 2019-11-10 ENCOUNTER — Other Ambulatory Visit: Payer: Self-pay

## 2019-11-10 ENCOUNTER — Ambulatory Visit: Payer: No Typology Code available for payment source | Admitting: Physical Therapy

## 2019-11-10 ENCOUNTER — Encounter: Payer: Self-pay | Admitting: Physical Therapy

## 2019-11-10 DIAGNOSIS — S42402D Unspecified fracture of lower end of left humerus, subsequent encounter for fracture with routine healing: Secondary | ICD-10-CM

## 2019-11-10 DIAGNOSIS — M25631 Stiffness of right wrist, not elsewhere classified: Secondary | ICD-10-CM

## 2019-11-10 DIAGNOSIS — M25622 Stiffness of left elbow, not elsewhere classified: Secondary | ICD-10-CM

## 2019-11-10 DIAGNOSIS — M25522 Pain in left elbow: Secondary | ICD-10-CM

## 2019-11-10 DIAGNOSIS — M25531 Pain in right wrist: Secondary | ICD-10-CM

## 2019-11-10 DIAGNOSIS — S62102D Fracture of unspecified carpal bone, left wrist, subsequent encounter for fracture with routine healing: Secondary | ICD-10-CM

## 2019-11-10 NOTE — Therapy (Signed)
[facility] [address] [place], [place], [address] Phone: [phone]   Fax:  [phone]  Physical Therapy Treatment  Patient Details  Name: Edward Wall MRN: [id number] Date of Birth: [birth year] Referring Provider (PT): Ulyses Southward   PA-C   Encounter Date: 11/10/2019  PT End of Session - 11/10/19 0935    Visit Number  12    Number of Visits  24    Date for PT Re-Evaluation  11/26/19    Authorization Type  self pay    PT Start Time  0930    PT Stop Time  1010    PT Time Calculation (min)  40 min       History reviewed. No pertinent past medical history.  Past Surgical History:  Procedure Laterality Date  . ORIF WRIST FRACTURE Right 07/16/2019   Procedure: OPEN REDUCTION INTERNAL FIXATION (ORIF) WRIST FRACTURE;  Surgeon: Roby Lofts, MD;  Location: [facility] OR;  Service: Orthopedics;  Laterality: Right;    There were no vitals filed for this visit.  Subjective Assessment - 11/10/19 0931    Subjective  Went back to work Sunday. Right arm was stinging after work for about an hour. I was able to lift and carry plates, just had to brace myself.    Currently in Pain?  Yes    Pain Score  2     Pain Location  Wrist    Pain Orientation  Right    Pain Descriptors / Indicators  Aching    Pain Type  Surgical pain    Aggravating Factors   working, gripping, lifting    Pain Relieving Factors  heat, rest                       [facility] Adult PT Treatment/Exercise - 11/10/19 0001      Elbow Exercises   Forearm Supination Limitations  red band    Forearm Pronation Limitations  red band    Other elbow exercises  Bicep Curl 6# x 20     Other elbow exercises  over head tricep press 5#  x 20 RT/LT       Shoulder Exercises: ROM/Strengthening   UBE (Upper Arm Bike)  L4  8 minutes half eay way     Other ROM/Strengthening Exercises  shoulder lateral raise3# , forward raise thumb up 3# x 20     Other ROM/Strengthening  Exercises  cabinet reach 5#, 6# top shelf in cabinet RT x 10       Wrist Exercises   Wrist Flexion  20 reps;Right    Bar Weights/Barbell (Wrist Flexion)  3 lbs    Wrist Flexion Limitations  also red band     Wrist Extension  Right;20 reps    Bar Weights/Barbell (Wrist Extension)  3 lbs    Wrist Extension Limitations  also red band     Wrist Radial Deviation Limitations  also red band     Other wrist exercises  velcro pad finger truns in wide velcro strip, wrist flexion and extension handle turns on wide velcro                PT Short Term Goals - 11/04/19 1001      PT SHORT TERM GOAL #1   Title  He will be indpendent with all initial HEP    Status  Achieved      PT SHORT TERM GOAL #3   Title  He will be able to  supinate RT wrist    +15 degrees    Status  Achieved      PT SHORT TERM GOAL #4   Title  Active RT wrist extension to 15 degrees    Status  Achieved        PT Long Term Goals - 09/07/19 0939      PT LONG TERM GOAL #1   Title  He will be independent with all hEP    Time  12    Period  Weeks    Status  New      PT LONG TERM GOAL #2   Title  He will be able to reach overhead with  5#   to access overhead cabinets.    Time  12    Period  Weeks    Status  New      PT LONG TERM GOAL #3   Title  He will be able to open bottles and jars,  and open doors with min to no pain.    Time  12    Period  Weeks    Status  New      PT LONG TERM GOAL #4   Title  He will return to driving and normal work activity    Time  12    Period  Weeks    Status  New      PT LONG TERM GOAL #5   Title  FOTO score will decreased to 44% limited or better to demo percieved functional improvement    Time  12    Period  Weeks    Status  New            Plan - 11/10/19 1008    Clinical Impression Statement  Worked on overhead cabinet reaching and he is able to lift 5 and 6# multiple repetitions. He reports being able to lifts stacks of plates for work. He was given a  red band and instructed in wrist strengthening.    PT Next Visit Plan  active and passive  ROM mobs.  , STW   review HEP ( he is modifying)   heat                   FOTO    PT Home Exercise Plan  wrist flex/ext/supination/pronation elbow flex/ext, shoulder flexion /ER, added towel squeeze, Pronation/ Supination with light handle, DIP self assisted flexion and extension.       Patient will benefit from skilled therapeutic intervention in order to improve the following deficits and impairments:  Pain, Decreased range of motion, Decreased activity tolerance, Decreased strength, Impaired UE functional use  Visit Diagnosis: Closed fracture of left elbow with routine healing, subsequent encounter  Closed fracture of left wrist with routine healing, subsequent encounter  Pain in right wrist  Stiffness of right wrist, not elsewhere classified  Stiffness of left elbow, not elsewhere classified  Pain in left elbow     Problem List Patient Active Problem List   Diagnosis Date Noted  . Laceration of right hand 07/19/2019  . Fall from balcony 07/16/2019  . Closed fracture of right distal radius and ulna, initial encounter 07/16/2019  . Closed nondisplaced fracture of right clavicle 07/16/2019  . Liver laceration, closed, initial encounter 07/16/2019  . Multiple rib fractures 07/16/2019    Sherrie Mustache, PTA 11/10/2019, 10:14 AM  [facility] [address] [place], [place], [address] Phone: [phone]   Fax:  [phone]  Name:  Seif Teichert MRN: [id number] Date of Birth: 17-Mar-1986

## 2019-11-11 ENCOUNTER — Ambulatory Visit: Payer: No Typology Code available for payment source

## 2019-11-11 DIAGNOSIS — M25531 Pain in right wrist: Secondary | ICD-10-CM

## 2019-11-11 DIAGNOSIS — M25631 Stiffness of right wrist, not elsewhere classified: Secondary | ICD-10-CM

## 2019-11-11 DIAGNOSIS — S42402D Unspecified fracture of lower end of left humerus, subsequent encounter for fracture with routine healing: Secondary | ICD-10-CM

## 2019-11-11 DIAGNOSIS — S62102D Fracture of unspecified carpal bone, left wrist, subsequent encounter for fracture with routine healing: Secondary | ICD-10-CM

## 2019-11-11 NOTE — Therapy (Signed)
[facility] [address] [place], [place], [address] Phone: [phone]   Fax:  [phone]  Physical Therapy Treatment  Patient Details  Name: Edward Wall MRN: [id number] Date of Birth: 11/24/1985 Referring Provider (PT): Ulyses Southward   PA-C   Encounter Date: 11/11/2019  PT End of Session - 11/11/19 0932    Visit Number  13    Number of Visits  24    Date for PT Re-Evaluation  11/26/19    Authorization Type  self pay    PT Start Time  0915    PT Stop Time  1000    PT Time Calculation (min)  45 min    Activity Tolerance  Patient tolerated treatment well    Behavior During Therapy  [facility] for tasks assessed/performed       No past medical history on file.  Past Surgical History:  Procedure Laterality Date  . ORIF WRIST FRACTURE Right 07/16/2019   Procedure: OPEN REDUCTION INTERNAL FIXATION (ORIF) WRIST FRACTURE;  Surgeon: Roby Lofts, MD;  Location: [facility] OR;  Service: Orthopedics;  Laterality: Right;    There were no vitals filed for this visit.  Subjective Assessment - 11/11/19 1003    Subjective  Woking is difficult but can do it with modifications.   Sore after    Pain Score  2     Pain Location  Wrist    Pain Orientation  Right    Pain Descriptors / Indicators  Aching    Pain Type  Surgical pain    Pain Onset  More than a month ago    Pain Frequency  Constant    Aggravating Factors   work , grip , lifitng    Pain Relieving Factors  heat rest         [facility] PT Assessment - 11/11/19 0001      PROM   Right Wrist Extension  53 Degrees   elbow 90 degres   Right Wrist Flexion  60 Degrees   elbow 90 degrees                    [facility] Adult PT Treatment/Exercise - 11/11/19 0001      Elbow Exercises   Other elbow exercises  over head tricep press 5#  x 20 RT/LT       Shoulder Exercises: ROM/Strengthening   UBE (Upper Arm Bike)  L4  8 minutes half eay way       Wrist Exercises   Wrist Flexion   20 reps;Right    Bar Weights/Barbell (Wrist Flexion)  4 lbs    Wrist Extension  Right;20 reps    Bar Weights/Barbell (Wrist Extension)  4 lbs    Wrist Radial Deviation  Right;20 reps    Bar Weights/Barbell (Radial Deviation)  4 lbs    Other wrist exercises  velcro pad finger truns in wide velcro strip, wrist flexion and extension handle turns on wide velcro       Manual Therapy   Joint Mobilization  distraction and PA and AP glides to wrist /elbow radial head and fingers. GR2-3    Passive ROM  all planes fingeers and wrist and elbow RT and LT.                  PT Short Term Goals - 11/04/19 1001      PT SHORT TERM GOAL #1   Title  He will be indpendent with all initial HEP    Status  Achieved      PT SHORT TERM GOAL #3   Title  He will be able to supinate RT wrist    +15 degrees    Status  Achieved      PT SHORT TERM GOAL #4   Title  Active RT wrist extension to 15 degrees    Status  Achieved        PT Long Term Goals - 09/07/19 0939      PT LONG TERM GOAL #1   Title  He will be independent with all hEP    Time  12    Period  Weeks    Status  New      PT LONG TERM GOAL #2   Title  He will be able to reach overhead with  5#   to access overhead cabinets.    Time  12    Period  Weeks    Status  New      PT LONG TERM GOAL #3   Title  He will be able to open bottles and jars,  and open doors with min to no pain.    Time  12    Period  Weeks    Status  New      PT LONG TERM GOAL #4   Title  He will return to driving and normal work activity    Time  12    Period  Weeks    Status  New      PT LONG TERM GOAL #5   Title  FOTO score will decreased to 44% limited or better to demo percieved functional improvement    Time  12    Period  Weeks    Status  New            Plan - 11/11/19 1000    Clinical Impression Statement  ROM increased again and pt informed. We will continue to push ROM all directiosn Tolerated incr weight with exercises    PT  Treatment/Interventions  Taping;Manual techniques;Patient/family education;Therapeutic activities;Therapeutic exercise;Cryotherapy;Moist Heat;Passive range of motion    PT Next Visit Plan  active and passive  ROM mobs.  , STW   review HEP ( he is modifying)   heat                   FOTO    PT Home Exercise Plan  wrist flex/ext/supination/pronation elbow flex/ext, shoulder flexion /ER, added towel squeeze, Pronation/ Supination with light handle, DIP self assisted flexion and extension.    Consulted and Agree with Plan of Care  Patient       Patient will benefit from skilled therapeutic intervention in order to improve the following deficits and impairments:  Pain, Decreased range of motion, Decreased activity tolerance, Decreased strength, Impaired UE functional use  Visit Diagnosis: Closed fracture of left elbow with routine healing, subsequent encounter  Closed fracture of left wrist with routine healing, subsequent encounter  Pain in right wrist  Stiffness of right wrist, not elsewhere classified     Problem List Patient Active Problem List   Diagnosis Date Noted  . Laceration of right hand 07/19/2019  . Fall from balcony 07/16/2019  . Closed fracture of right distal radius and ulna, initial encounter 07/16/2019  . Closed nondisplaced fracture of right clavicle 07/16/2019  . Liver laceration, closed, initial encounter 07/16/2019  . Multiple rib fractures 07/16/2019    [name] PT 11/11/2019, 10:23 AM  [facility] [phone]  [address] [place], [place], [address] Phone: [phone]   Fax:  [phone]  Name: Edward Wall MRN: [id number] Date of Birth: 10-06-1986

## 2019-11-15 ENCOUNTER — Ambulatory Visit: Payer: No Typology Code available for payment source

## 2019-11-17 ENCOUNTER — Ambulatory Visit: Payer: No Typology Code available for payment source | Admitting: Physical Therapy

## 2019-11-22 ENCOUNTER — Ambulatory Visit: Payer: No Typology Code available for payment source | Attending: Student

## 2019-11-22 ENCOUNTER — Other Ambulatory Visit: Payer: Self-pay

## 2019-11-22 DIAGNOSIS — S62102D Fracture of unspecified carpal bone, left wrist, subsequent encounter for fracture with routine healing: Secondary | ICD-10-CM | POA: Diagnosis present

## 2019-11-22 DIAGNOSIS — M25622 Stiffness of left elbow, not elsewhere classified: Secondary | ICD-10-CM | POA: Diagnosis present

## 2019-11-22 DIAGNOSIS — S42402D Unspecified fracture of lower end of left humerus, subsequent encounter for fracture with routine healing: Secondary | ICD-10-CM | POA: Insufficient documentation

## 2019-11-22 DIAGNOSIS — M25522 Pain in left elbow: Secondary | ICD-10-CM | POA: Diagnosis present

## 2019-11-22 DIAGNOSIS — M25631 Stiffness of right wrist, not elsewhere classified: Secondary | ICD-10-CM | POA: Diagnosis present

## 2019-11-22 DIAGNOSIS — M25531 Pain in right wrist: Secondary | ICD-10-CM | POA: Diagnosis present

## 2019-11-22 NOTE — Therapy (Addendum)
[facility] [address] [place], [place], [address] Phone: [phone]   Fax:  [phone]  Physical Therapy Treatment  Patient Details  Name: Edward Wall MRN: [id number] Date of Birth: 13-Apr-1986 Referring Provider (PT): Ulyses Southward   PA-C   Encounter Date: 11/22/2019  PT End of Session - 11/22/19 0916    Visit Number  14    Number of Visits  24    Date for PT Re-Evaluation  11/26/19    Authorization Type  self pay    PT Start Time  0915    PT Stop Time  1000    PT Time Calculation (min)  45 min    Activity Tolerance  Patient tolerated treatment well    Behavior During Therapy  [facility] for tasks assessed/performed       No past medical history on file.  Past Surgical History:  Procedure Laterality Date  . ORIF WRIST FRACTURE Right 07/16/2019   Procedure: OPEN REDUCTION INTERNAL FIXATION (ORIF) WRIST FRACTURE;  Surgeon: Roby Lofts, MD;  Location: [facility] OR;  Service: Orthopedics;  Laterality: Right;    There were no vitals filed for this visit.  Subjective Assessment - 11/22/19 1006    Subjective  Still stiff and sore but doing better    Pain Score  2     Pain Location  Wrist    Pain Orientation  Right    Pain Descriptors / Indicators  Aching    Pain Type  Chronic pain    Pain Onset  More than a month ago    Pain Frequency  Constant    Aggravating Factors   work , gripping . lifting    Pain Relieving Factors  heat , rest         [facility] PT Assessment - 11/22/19 0001      AROM   Right Wrist Extension  49 Degrees    Right Wrist Flexion  40 Degrees                   [facility] Adult PT Treatment/Exercise - 11/22/19 0001      Shoulder Exercises: ROM/Strengthening   UBE (Upper Arm Bike)  L4  8 minutes half eay way     Other ROM/Strengthening Exercises  shoulder lateral raise 4# , forward raise thumb up 4# x 20     Other ROM/Strengthening Exercises  cabinet reach , 6# top shelf in cabinet RT x 10   then  8# x 10 RT and LT      Manual Therapy   Joint Mobilization  distraction and PA and AP glides to wrist /elbow radial head and fingers. GR2-3    Passive ROM  all planes fingeers and wrist and elbow RT and LT.                  PT Short Term Goals - 11/04/19 1001      PT SHORT TERM GOAL #1   Title  He will be indpendent with all initial HEP    Status  Achieved      PT SHORT TERM GOAL #3   Title  He will be able to supinate RT wrist    +15 degrees    Status  Achieved      PT SHORT TERM GOAL #4   Title  Active RT wrist extension to 15 degrees    Status  Achieved        PT Long Term Goals - 11/22/19 6503  PT LONG TERM GOAL #1   Title  He will be independent with all hEP    Status  On-going      PT LONG TERM GOAL #2   Title  He will be able to reach overhead with  5#   to access overhead cabinets.    Status  Achieved      PT LONG TERM GOAL #3   Title  He will be able to open bottles and jars,  and open doors with min to no pain.    Baseline  still haveing issues    Status  On-going      PT LONG TERM GOAL #4   Title  He will return to driving and normal work activity    Baseline  still limited    Status  On-going      PT LONG TERM GOAL #5   Title  FOTO score will decreased to 44% limited or better to demo percieved functional improvement    Status  Unable to assess            Plan - 11/22/19 0916    Clinical Impression Statement  Extension improved. Flexion no change RT wrist. He could benefit from more PT for at least another month.    PT Treatment/Interventions  Taping;Manual techniques;Patient/family education;Therapeutic activities;Therapeutic exercise;Cryotherapy;Moist Heat;Passive range of motion    PT Next Visit Plan  active and passive  ROM mobs.  , STW   review HEP ( he is modifying)   heat                   FOTOnext or first visit next week    PT Home Exercise Plan  wrist flex/ext/supination/pronation elbow flex/ext, shoulder flexion /ER, added  towel squeeze, Pronation/ Supination with light handle, DIP self assisted flexion and extension.    Consulted and Agree with Plan of Care  Patient       Patient will benefit from skilled therapeutic intervention in order to improve the following deficits and impairments:  Pain, Decreased range of motion, Decreased activity tolerance, Decreased strength, Impaired UE functional use  Visit Diagnosis: Closed fracture of left wrist with routine healing, subsequent encounter  Closed fracture of left elbow with routine healing, subsequent encounter  Pain in right wrist  Stiffness of right wrist, not elsewhere classified     Problem List Patient Active Problem List   Diagnosis Date Noted  . Laceration of right hand 07/19/2019  . Fall from balcony 07/16/2019  . Closed fracture of right distal radius and ulna, initial encounter 07/16/2019  . Closed nondisplaced fracture of right clavicle 07/16/2019  . Liver laceration, closed, initial encounter 07/16/2019  . Multiple rib fractures 07/16/2019    Edward Wall  PT 11/22/2019, 10:07 AM  [facility] [address] [place], [place], [address] Phone: [phone]   Fax:  [phone]  Name: Edward Wall MRN: [id number] Date of Birth: Dec 01, 1985

## 2019-11-24 ENCOUNTER — Ambulatory Visit: Payer: No Typology Code available for payment source | Admitting: Physical Therapy

## 2019-11-29 ENCOUNTER — Ambulatory Visit: Payer: No Typology Code available for payment source

## 2019-11-29 ENCOUNTER — Other Ambulatory Visit: Payer: Self-pay

## 2019-11-29 DIAGNOSIS — M25631 Stiffness of right wrist, not elsewhere classified: Secondary | ICD-10-CM

## 2019-11-29 DIAGNOSIS — S62102D Fracture of unspecified carpal bone, left wrist, subsequent encounter for fracture with routine healing: Secondary | ICD-10-CM | POA: Diagnosis not present

## 2019-11-29 DIAGNOSIS — S42402D Unspecified fracture of lower end of left humerus, subsequent encounter for fracture with routine healing: Secondary | ICD-10-CM

## 2019-11-29 DIAGNOSIS — M25531 Pain in right wrist: Secondary | ICD-10-CM

## 2019-11-29 NOTE — Therapy (Signed)
[facility] [address] [place], [place], [address] Phone: [phone]   Fax:  [phone]  Physical Therapy Treatment  Patient Details  Name: Edward Wall MRN: [id number] Date of Birth: 1986-06-22 Referring Provider (PT): Ulyses Southward   PA-C   Encounter Date: 11/29/2019  PT End of Session - 11/29/19 0918    Visit Number  15    Number of Visits  24    Date for PT Re-Evaluation  11/26/19    Authorization Type  self pay    PT Start Time  0915    PT Stop Time  1000    PT Time Calculation (min)  45 min    Activity Tolerance  Patient tolerated treatment well    Behavior During Therapy  [facility] for tasks assessed/performed       History reviewed. No pertinent past medical history.  Past Surgical History:  Procedure Laterality Date  . ORIF WRIST FRACTURE Right 07/16/2019   Procedure: OPEN REDUCTION INTERNAL FIXATION (ORIF) WRIST FRACTURE;  Surgeon: Roby Lofts, MD;  Location[facility];  Service: Orthopedics;  Laterality: Right;    There were no vitals filed for this visit.  Subjective Assessment - 11/29/19 0925    Subjective  No pain today to start    Currently in Pain?  No/denies         [facility] PT Assessment - 11/29/19 0001      Observation/Other Assessments   Focus on Therapeutic Outcomes (FOTO)   47% limited    predicted is 44% limited      AROM   Left Elbow Flexion  145    Left Elbow Extension  0    Right Wrist Extension  50 Degrees    Right Wrist Flexion  45 Degrees      PROM   Right Wrist Extension  60 Degrees    Right Wrist Flexion  60 Degrees                   [facility] Adult PT Treatment/Exercise - 11/29/19 0001      Shoulder Exercises: ROM/Strengthening   UBE (Upper Arm Bike)  L4  8 minutes half eay way     Other ROM/Strengthening Exercises  shoulder lateral raise 5# , forward raise thumb up 5# x 20     Other ROM/Strengthening Exercises  over head press 8# x 20      Wrist Exercises   Wrist  Flexion  20 reps;Right    Bar Weights/Barbell (Wrist Flexion)  5 lbs    Wrist Extension  Right;20 reps    Bar Weights/Barbell (Wrist Extension)  5 lbs    Wrist Radial Deviation  Right;20 reps    Bar Weights/Barbell (Radial Deviation)  5 lbs      Manual Therapy   Joint Mobilization  distraction and PA and AP glides to wrist /elbow radial head and fingers. GR2-3    Passive ROM  all planes fingeers and wrist and elbow RT and LT.                  PT Short Term Goals - 11/04/19 1001      PT SHORT TERM GOAL #1   Title  He will be indpendent with all initial HEP    Status  Achieved      PT SHORT TERM GOAL #3   Title  He will be able to supinate RT wrist    +15 degrees    Status  Achieved  PT SHORT TERM GOAL #4   Title  Active RT wrist extension to 15 degrees    Status  Achieved        PT Long Term Goals - 11/29/19 0940      PT LONG TERM GOAL #1   Title  He will be independent with all hEP    Status  On-going      PT LONG TERM GOAL #2   Title  He will be able to reach overhead with  5#   to access overhead cabinets.    Baseline  8# today    Status  Achieved      PT LONG TERM GOAL #3   Title  He will be able to open bottles and jars,  and open doors with min to no pain.    Baseline  still haveing issues      PT LONG TERM GOAL #4   Title  He will return to driving and normal work activity    Baseline  still limited      PT LONG TERM GOAL #5   Title  FOTO score will decreased to 44% limited or better to demo percieved functional improvement    Baseline  47% limited    Status  On-going            Plan - 11/29/19 0932    Clinical Impression Statement  LT elbow ROM normal    PT Treatment/Interventions  Taping;Manual techniques;Patient/family education;Therapeutic activities;Therapeutic exercise;Cryotherapy;Moist Heat;Passive range of motion    PT Next Visit Plan  active and passive  ROM mobs.     heat                   FOTO next or first visit next week     PT Home Exercise Plan  wrist flex/ext/supination/pronation elbow flex/ext, shoulder flexion /ER, added towel squeeze, Pronation/ Supination with light handle, DIP self assisted flexion and extension.    Consulted and Agree with Plan of Care  Patient       Patient will benefit from skilled therapeutic intervention in order to improve the following deficits and impairments:  Pain, Decreased range of motion, Decreased activity tolerance, Decreased strength, Impaired UE functional use  Visit Diagnosis: Closed fracture of left wrist with routine healing, subsequent encounter  Closed fracture of left elbow with routine healing, subsequent encounter  Pain in right wrist  Stiffness of right wrist, not elsewhere classified     Problem List Patient Active Problem List   Diagnosis Date Noted  . Laceration of right hand 07/19/2019  . Fall from balcony 07/16/2019  . Closed fracture of right distal radius and ulna, initial encounter 07/16/2019  . Closed nondisplaced fracture of right clavicle 07/16/2019  . Liver laceration, closed, initial encounter 07/16/2019  . Multiple rib fractures 07/16/2019    [name]  PT 11/29/2019, 10:01 AM  [facility] [address] [place], [place], [address] Phone: [phone]   Fax:  [phone]  Name: Izeah Vossler MRN: [id number] Date of Birth: 1985-12-30

## 2019-11-30 NOTE — Telephone Encounter (Signed)
No message left

## 2019-12-01 ENCOUNTER — Ambulatory Visit: Payer: No Typology Code available for payment source | Admitting: Physical Therapy

## 2019-12-01 ENCOUNTER — Other Ambulatory Visit: Payer: Self-pay

## 2019-12-01 ENCOUNTER — Encounter: Payer: Self-pay | Admitting: Physical Therapy

## 2019-12-01 DIAGNOSIS — M25631 Stiffness of right wrist, not elsewhere classified: Secondary | ICD-10-CM

## 2019-12-01 DIAGNOSIS — M25531 Pain in right wrist: Secondary | ICD-10-CM

## 2019-12-01 DIAGNOSIS — S62102D Fracture of unspecified carpal bone, left wrist, subsequent encounter for fracture with routine healing: Secondary | ICD-10-CM

## 2019-12-01 DIAGNOSIS — M25622 Stiffness of left elbow, not elsewhere classified: Secondary | ICD-10-CM

## 2019-12-01 DIAGNOSIS — M25522 Pain in left elbow: Secondary | ICD-10-CM

## 2019-12-01 DIAGNOSIS — S42402D Unspecified fracture of lower end of left humerus, subsequent encounter for fracture with routine healing: Secondary | ICD-10-CM

## 2019-12-01 NOTE — Therapy (Signed)
[place], [place], [address] Phone: [phone]   Fax:  [phone]  Physical Therapy Treatment  Patient Details  Name: Edward Wall MRN: [id number] Date of Birth: 1986-03-08 Referring Provider (PT): Patrecia Pace   PA-C   Encounter Date: 12/01/2019  PT End of Session - 12/01/19 0922    Visit Number  16    Number of Visits  24    Date for PT Re-Evaluation  11/26/19    Authorization Type  self pay    PT Start Time  0920    PT Stop Time  1000    PT Time Calculation (min)  40 min       History reviewed. No pertinent past medical history.  Past Surgical History:  Procedure Laterality Date  . ORIF WRIST FRACTURE Right 07/16/2019   Procedure: OPEN REDUCTION INTERNAL FIXATION (ORIF) WRIST FRACTURE;  Surgeon: Shona Needles, MD;  Location: [place];  Service: Orthopedics;  Laterality: Right;    There were no vitals filed for this visit.  Subjective Assessment - 12/01/19 0921    Subjective  Much better than I have been.    Currently in Pain?  Yes    Pain Score  1     Pain Location  Wrist    Pain Orientation  Right    Pain Descriptors / Indicators  Aching    Aggravating Factors   forget and then lift something    Pain Relieving Factors  rest, avoid heavy lifting                       [facility] Adult PT Treatment/Exercise - 12/01/19 0001      Elbow Exercises   Other elbow exercises  Bicep Curl 6# x 20     Other elbow exercises  over head tricep press 5#  x 20 RT/LT , Bent over rows 8# each 10x2 needs cues for spine alignment       Shoulder Exercises: ROM/Strengthening   UBE (Upper Arm Bike)  L4  8 minutes half eay way     Other ROM/Strengthening Exercises  shoulder lateral raise 5# , forward raise thumb up 5# x 20 , attempting to place hands on wall starting above head and sliding down, unable to right,     Other ROM/Strengthening Exercises  over head press 8# x 20, hammer curls 8# x 20  each       Hand Exercises   Other Hand Exercises  Digi Grip x 10 with 5 sec hold , slow release       Wrist Exercises   Wrist Flexion  Right;Left;20 reps    Bar Weights/Barbell (Wrist Flexion)  5 lbs    Wrist Extension  Right;Left;20 reps    Bar Weights/Barbell (Wrist Extension)  5 lbs    Wrist Radial Deviation  Right;Left;20 reps    Bar Weights/Barbell (Radial Deviation)  5 lbs    Other wrist exercises  velcro pad finger truns in wide velcro strip, wrist flexion and extension handle turns on wide velcro    finger twisting     Manual Therapy   Passive ROM  Right fingers and wrist                PT Short Term Goals - 12/01/19 2094      PT SHORT TERM GOAL #1   Title  He will be indpendent with all initial HEP    Time  4  Period  Weeks    Status  Achieved      PT SHORT TERM GOAL #2   Title  He will improve LT elbow flexion and extension to equal RT active    Time  4    Period  Weeks    Status  Achieved      PT SHORT TERM GOAL #3   Title  He will be able to supinate RT wrist    +15 degrees    Time  4    Period  Weeks    Status  Achieved      PT SHORT TERM GOAL #4   Title  Active RT wrist extension to 15 degrees    Time  4    Period  Weeks    Status  Achieved        PT Long Term Goals - 11/29/19 0940      PT LONG TERM GOAL #1   Title  He will be independent with all hEP    Status  On-going      PT LONG TERM GOAL #2   Title  He will be able to reach overhead with  5#   to access overhead cabinets.    Baseline  8# today    Status  Achieved      PT LONG TERM GOAL #3   Title  He will be able to open bottles and jars,  and open doors with min to no pain.    Baseline  still haveing issues      PT LONG TERM GOAL #4   Title  He will return to driving and normal work activity    Baseline  still limited      PT LONG TERM GOAL #5   Title  FOTO score will decreased to 44% limited or better to demo percieved functional improvement    Baseline  47% limited     Status  On-going            Plan - 12/01/19 6314    Clinical Impression Statement  Pt reports he is much improved and sometimes forgets about his wrist until he lifts something. Still having issues with starting car using right hand. Can work as long as he modifies actions as needed. Progressing toward remaining goals. Continued with ROM and strengthening as tolerated.    PT Next Visit Plan  strengthening and ROM    PT Home Exercise Plan  wrist flex/ext/supination/pronation elbow flex/ext, shoulder flexion /ER, added towel squeeze, Pronation/ Supination with light handle, DIP self assisted flexion and extension.       Patient will benefit from skilled therapeutic intervention in order to improve the following deficits and impairments:  Pain, Decreased range of motion, Decreased activity tolerance, Decreased strength, Impaired UE functional use  Visit Diagnosis: Closed fracture of left wrist with routine healing, subsequent encounter  Closed fracture of left elbow with routine healing, subsequent encounter  Pain in right wrist  Stiffness of right wrist, not elsewhere classified  Stiffness of left elbow, not elsewhere classified  Pain in left elbow     Problem List Patient Active Problem List   Diagnosis Date Noted  . Laceration of right hand 07/19/2019  . Fall from balcony 07/16/2019  . Closed fracture of right distal radius and ulna, initial encounter 07/16/2019  . Closed nondisplaced fracture of right clavicle 07/16/2019  . Liver laceration, closed, initial encounter 07/16/2019  . Multiple rib fractures 07/16/2019    Edward Wall, PTA 12/01/2019,  10:29 AM  [facility] [address] [place], [place], [address] Phone: [phone]   Fax:  [phone]  Name: Edward Wall MRN: [id number] Date of Birth: 1986-01-17

## 2019-12-06 ENCOUNTER — Ambulatory Visit: Payer: No Typology Code available for payment source

## 2019-12-08 ENCOUNTER — Ambulatory Visit: Payer: No Typology Code available for payment source | Admitting: Physical Therapy

## 2019-12-13 ENCOUNTER — Other Ambulatory Visit: Payer: Self-pay

## 2019-12-13 ENCOUNTER — Ambulatory Visit: Payer: No Typology Code available for payment source

## 2019-12-13 DIAGNOSIS — S62102D Fracture of unspecified carpal bone, left wrist, subsequent encounter for fracture with routine healing: Secondary | ICD-10-CM

## 2019-12-13 DIAGNOSIS — M25631 Stiffness of right wrist, not elsewhere classified: Secondary | ICD-10-CM

## 2019-12-13 DIAGNOSIS — M25531 Pain in right wrist: Secondary | ICD-10-CM

## 2019-12-13 NOTE — Therapy (Addendum)
[place], [place], [address] Phone: [phone]   Fax:  [phone]  Physical Therapy Treatment  Patient Details  Name: Edward Wall MRN: [id number] Date of Birth: 05/07/1986 Referring Provider (PT): Patrecia Pace   PA-C   Encounter Date: 12/13/2019  PT End of Session - 12/13/19 1007    Visit Number  17    Number of Visits  24    Date for PT Re-Evaluation  01/07/20   Authorization Type  self pay    PT Start Time  1005    PT Stop Time  1045    PT Time Calculation (min)  40 min    Activity Tolerance  Patient tolerated treatment well    Behavior During Therapy  [facility] for tasks assessed/performed       History reviewed. No pertinent past medical history.  Past Surgical History:  Procedure Laterality Date  . ORIF WRIST FRACTURE Right 07/16/2019   Procedure: OPEN REDUCTION INTERNAL FIXATION (ORIF) WRIST FRACTURE;  Surgeon: Shona Needles, MD;  Location: [place];  Service: Orthopedics;  Laterality: Right;    There were no vitals filed for this visit.  Subjective Assessment - 12/13/19 1012    Subjective  Doing alot better. no complaints    Pain Score  1     Pain Location  Wrist    Pain Orientation  Right    Pain Descriptors / Indicators  Aching    Pain Type  Chronic pain    Pain Onset  More than a month ago    Pain Frequency  Intermittent    Aggravating Factors   lifting         [facility] PT Assessment - 12/13/19 0001      AROM   Right Wrist Extension  55 Degrees    Right Wrist Flexion  45 Degrees    Right Wrist Radial Deviation  15 Degrees    Right Wrist Ulnar Deviation  40 Degrees      PROM   Right Wrist Extension  68 Degrees    Right Wrist Flexion  58 Degrees                   [facility] Adult PT Treatment/Exercise - 12/13/19 0001      Shoulder Exercises: ROM/Strengthening   UBE (Upper Arm Bike)  L4  8 minutes half eay way     Other ROM/Strengthening Exercises  shoulder lateral  raise 5# , forward raise thumb up 5# x 20 , attempting to place hands on wall starting above head and sliding down, unable to right,     Other ROM/Strengthening Exercises  over head press 8# x 20, hammer curls 8# x 20 each       Hand Exercises   Other Hand Exercises  Digi Grip x 10 with 5 sec hold , slow release       Wrist Exercises   Wrist Flexion  Right;Left;20 reps    Bar Weights/Barbell (Wrist Flexion)  5 lbs    Wrist Extension  Right;Left;20 reps    Bar Weights/Barbell (Wrist Extension)  5 lbs    Wrist Radial Deviation  Right;Left;20 reps    Bar Weights/Barbell (Radial Deviation)  5 lbs      Manual Therapy   Joint Mobilization  distraction and PA and AP glides to wrist /elbow radial head and fingers. GR2-3    Passive ROM  Right fingers and wrist  PT Short Term Goals - 12/01/19 6384      PT SHORT TERM GOAL #1   Title  He will be indpendent with all initial HEP    Time  4    Period  Weeks    Status  Achieved      PT SHORT TERM GOAL #2   Title  He will improve LT elbow flexion and extension to equal RT active    Time  4    Period  Weeks    Status  Achieved      PT SHORT TERM GOAL #3   Title  He will be able to supinate RT wrist    +15 degrees    Time  4    Period  Weeks    Status  Achieved      PT SHORT TERM GOAL #4   Title  Active RT wrist extension to 15 degrees    Time  4    Period  Weeks    Status  Achieved        PT Long Term Goals - 12/13/19 1101      PT LONG TERM GOAL #1   Title  He will be independent with all hEP    Status  On-going      PT LONG TERM GOAL #2   Title  He will be able to reach overhead with  5#   to access overhead cabinets.    Status  Achieved      PT LONG TERM GOAL #3   Title  He will be able to open bottles and jars,  and open doors with min to no pain.    Status  On-going      PT LONG TERM GOAL #4   Title  He will return to driving and normal work activity    Baseline  still limited at work  withlifitng    Status  Partially [place] #5   Title  FOTO score will decreased to 44% limited or better to demo percieved functional improvement    Baseline  47% limited    Status  On-going            Plan - 12/13/19 1100    Clinical Impression Statement  Subjectivley he is improving. Still stiff in RT wrist but tolerating increased load with exercixse.,  Lifting heavier weight stil incr pain.    PT Treatment/Interventions  Taping;Manual techniques;Patient/family education;Therapeutic activities;Therapeutic exercise;Cryotherapy;Moist Heat;Passive range of motion    PT Next Visit Plan  strengthening and ROM    PT Home Exercise Plan  wrist flex/ext/supination/pronation elbow flex/ext, shoulder flexion /ER, added towel squeeze, Pronation/ Supination with light handle, DIP self assisted flexion and extension.       Patient will benefit from skilled therapeutic intervention in order to improve the following deficits and impairments:  Pain, Decreased range of motion, Decreased activity tolerance, Decreased strength, Impaired UE functional use  Visit Diagnosis: Closed fracture of left wrist with routine healing, subsequent encounter  Pain in right wrist  Stiffness of right wrist, not elsewhere classified     Problem List Patient Active Problem List   Diagnosis Date Noted  . Laceration of right hand 07/19/2019  . Fall from balcony 07/16/2019  . Closed fracture of right distal radius and ulna, initial encounter 07/16/2019  . Closed nondisplaced fracture of right clavicle 07/16/2019  . Liver laceration, closed, initial encounter 07/16/2019  . Multiple rib fractures 07/16/2019  [name]  PT 12/13/2019, 11:15 AM  [facility] [address] [place], [place], [address] Phone: [phone]   Fax:  [phone]  Name: Edward Wall MRN: [id number] Date of Birth: [birth year]

## 2019-12-15 ENCOUNTER — Other Ambulatory Visit: Payer: Self-pay

## 2019-12-15 ENCOUNTER — Encounter: Payer: Self-pay | Admitting: Physical Therapy

## 2019-12-15 ENCOUNTER — Ambulatory Visit: Payer: No Typology Code available for payment source | Admitting: Physical Therapy

## 2019-12-15 DIAGNOSIS — M25622 Stiffness of left elbow, not elsewhere classified: Secondary | ICD-10-CM

## 2019-12-15 DIAGNOSIS — M25631 Stiffness of right wrist, not elsewhere classified: Secondary | ICD-10-CM

## 2019-12-15 DIAGNOSIS — S42402D Unspecified fracture of lower end of left humerus, subsequent encounter for fracture with routine healing: Secondary | ICD-10-CM

## 2019-12-15 DIAGNOSIS — M25522 Pain in left elbow: Secondary | ICD-10-CM

## 2019-12-15 DIAGNOSIS — S62102D Fracture of unspecified carpal bone, left wrist, subsequent encounter for fracture with routine healing: Secondary | ICD-10-CM | POA: Diagnosis not present

## 2019-12-15 DIAGNOSIS — M25531 Pain in right wrist: Secondary | ICD-10-CM

## 2019-12-15 NOTE — Therapy (Signed)
[place], [place], [address] Phone: [phone]   Fax:  [phone]  Physical Therapy Treatment  Patient Details  Name: Edward Wall MRN: [id number] Date of Birth: [birth year] Referring Provider (PT): Patrecia Pace   PA-C   Encounter Date: 12/15/2019  PT End of Session - 12/15/19 1013    Visit Number  18    Number of Visits  24    Date for PT Re-Evaluation  01/07/20    Authorization Type  self pay    PT Start Time  1015    PT Stop Time  1053    PT Time Calculation (min)  38 min       History reviewed. No pertinent past medical history.  Past Surgical History:  Procedure Laterality Date  . ORIF WRIST FRACTURE Right 07/16/2019   Procedure: OPEN REDUCTION INTERNAL FIXATION (ORIF) WRIST FRACTURE;  Surgeon: Shona Needles, MD;  Location: [place];  Service: Orthopedics;  Laterality: Right;    There were no vitals filed for this visit.                    [place] Adult PT Treatment/Exercise - 12/15/19 0001      Shoulder Exercises: ROM/Strengthening   UBE (Upper Arm Bike)  L4  8 minutes half eay way     Other ROM/Strengthening Exercises  shoulder lateral raise 5# , forward raise thumb up 5# x 20     Other ROM/Strengthening Exercises  over head press 8# x 20, hammer curls 8# x 20 each       Hand Exercises   Other Hand Exercises  Digi Grip x 10 with 5 sec hold , slow release       Wrist Exercises   Wrist Flexion  Right;Left;20 reps    Bar Weights/Barbell (Wrist Flexion)  5 lbs    Wrist Extension  Right;Left;20 reps    Bar Weights/Barbell (Wrist Extension)  5 lbs    Wrist Radial Deviation  Right;Left;20 reps    Bar Weights/Barbell (Radial Deviation)  5 lbs    Other wrist exercises  velcro pad finger truns in wide velcro strip, wrist flexion and extension handle turns on wide velcro    finger twisting     Manual Therapy   Passive ROM  Right fingers and wrist                 PT Short Term Goals - 12/01/19 3149      PT SHORT TERM GOAL #1   Title  He will be indpendent with all initial HEP    Time  4    Period  Weeks    Status  Achieved      PT SHORT TERM GOAL #2   Title  He will improve LT elbow flexion and extension to equal RT active    Time  4    Period  Weeks    Status  Achieved      PT SHORT TERM GOAL #3   Title  He will be able to supinate RT wrist    +15 degrees    Time  4    Period  Weeks    Status  Achieved      PT SHORT TERM GOAL #4   Title  Active RT wrist extension to 15 degrees    Time  4    Period  Weeks    Status  Achieved        PT Long  Term Goals - 12/13/19 1101      PT LONG TERM GOAL #1   Title  He will be independent with all hEP    Status  On-going      PT LONG TERM GOAL #2   Title  He will be able to reach overhead with  5#   to access overhead cabinets.    Status  Achieved      PT LONG TERM GOAL #3   Title  He will be able to open bottles and jars,  and open doors with min to no pain.    Status  On-going      PT LONG TERM GOAL #4   Title  He will return to driving and normal work activity    Baseline  still limited at work withlifitng    Status  Partially [place] #5   Title  FOTO score will decreased to 44% limited or better to demo percieved functional improvement    Baseline  47% limited    Status  On-going            Plan - 12/15/19 1043    Clinical Impression Statement  Continued with UE strengthening and ROM for right wrist. He reports pain when strengthening right wrist with 5Lb weight. He can lift and carry plates for job duties as needed with modifications.    PT Next Visit Plan  strengthening and ROM    PT Home Exercise Plan  wrist flex/ext/supination/pronation elbow flex/ext, shoulder flexion /ER, added towel squeeze, Pronation/ Supination with light handle, DIP self assisted flexion and extension.       Patient will benefit from skilled therapeutic  intervention in order to improve the following deficits and impairments:  Pain, Decreased range of motion, Decreased activity tolerance, Decreased strength, Impaired UE functional use  Visit Diagnosis: Closed fracture of left wrist with routine healing, subsequent encounter  Pain in right wrist  Stiffness of right wrist, not elsewhere classified  Closed fracture of left elbow with routine healing, subsequent encounter  Stiffness of left elbow, not elsewhere classified  Pain in left elbow     Problem List Patient Active Problem List   Diagnosis Date Noted  . Laceration of right hand 07/19/2019  . Fall from balcony 07/16/2019  . Closed fracture of right distal radius and ulna, initial encounter 07/16/2019  . Closed nondisplaced fracture of right clavicle 07/16/2019  . Liver laceration, closed, initial encounter 07/16/2019  . Multiple rib fractures 07/16/2019    Dorene Ar, PTA 12/15/2019, 11:00 AM  [place], [place], [address] Phone: [phone]   Fax:  [phone]  Name: Edward Wall MRN: [id number] Date of Birth: 25-Aug-1986

## 2019-12-15 NOTE — Addendum Note (Signed)
Addended by: [name] on: 12/15/2019 10:57 AM   Modules accepted: Orders

## 2019-12-20 ENCOUNTER — Ambulatory Visit: Payer: No Typology Code available for payment source

## 2019-12-22 ENCOUNTER — Ambulatory Visit: Payer: No Typology Code available for payment source | Attending: Student | Admitting: Physical Therapy

## 2019-12-22 ENCOUNTER — Encounter: Payer: Self-pay | Admitting: Physical Therapy

## 2019-12-22 ENCOUNTER — Other Ambulatory Visit: Payer: Self-pay

## 2019-12-22 DIAGNOSIS — M25622 Stiffness of left elbow, not elsewhere classified: Secondary | ICD-10-CM | POA: Insufficient documentation

## 2019-12-22 DIAGNOSIS — M25631 Stiffness of right wrist, not elsewhere classified: Secondary | ICD-10-CM | POA: Diagnosis present

## 2019-12-22 DIAGNOSIS — S42402D Unspecified fracture of lower end of left humerus, subsequent encounter for fracture with routine healing: Secondary | ICD-10-CM | POA: Insufficient documentation

## 2019-12-22 DIAGNOSIS — M25522 Pain in left elbow: Secondary | ICD-10-CM | POA: Diagnosis present

## 2019-12-22 DIAGNOSIS — S62102D Fracture of unspecified carpal bone, left wrist, subsequent encounter for fracture with routine healing: Secondary | ICD-10-CM

## 2019-12-22 DIAGNOSIS — M25531 Pain in right wrist: Secondary | ICD-10-CM | POA: Diagnosis present

## 2019-12-22 NOTE — Therapy (Signed)
[place], [place], [address] Phone: [phone]   Fax:  [phone]  Physical Therapy Treatment  Patient Details  Name: Edward Wall MRN: [id number] Date of Birth: [birth year] Referring Provider (PT): Patrecia Pace   PA-C   Encounter Date: 12/22/2019  PT End of Session - 12/22/19 1019    Visit Number  19    Number of Visits  24    Date for PT Re-Evaluation  01/07/20    Authorization Type  self pay    PT Start Time  1015    PT Stop Time  1053    PT Time Calculation (min)  38 min       History reviewed. No pertinent past medical history.  Past Surgical History:  Procedure Laterality Date  . ORIF WRIST FRACTURE Right 07/16/2019   Procedure: OPEN REDUCTION INTERNAL FIXATION (ORIF) WRIST FRACTURE;  Surgeon: Shona Needles, MD;  Location: [facility];  Service: Orthopedics;  Laterality: Right;    There were no vitals filed for this visit.  Subjective Assessment - 12/22/19 1018    Subjective  Still getting better.    Currently in Pain?  No/denies   just stiffness                      [facility] Adult PT Treatment/Exercise - 12/22/19 0001      Shoulder Exercises: ROM/Strengthening   UBE (Upper Arm Bike)  L4  8 minutes half eay way     Other ROM/Strengthening Exercises  shoulder lateral raise 5# , forward raise thumb up 5# x 20     Other ROM/Strengthening Exercises  over head press 8# x 20, hammer curls 8# x 20 each       Wrist Exercises   Wrist Flexion  Right;Left;20 reps    Bar Weights/Barbell (Wrist Flexion)  5 lbs    Wrist Extension  Right;Left;20 reps    Bar Weights/Barbell (Wrist Extension)  5 lbs    Wrist Radial Deviation  Right;Left;20 reps    Bar Weights/Barbell (Radial Deviation)  5 lbs    Other wrist exercises  velcro pad finger truns in wide velcro strip, wrist flexion and extension handle turns on wide velcro    finger twisting     Manual Therapy   Passive ROM  Right fingers  and wrist                PT Short Term Goals - 12/01/19 9017      PT SHORT TERM GOAL #1   Title  He will be indpendent with all initial HEP    Time  4    Period  Weeks    Status  Achieved      PT SHORT TERM GOAL #2   Title  He will improve LT elbow flexion and extension to equal RT active    Time  4    Period  Weeks    Status  Achieved      PT SHORT TERM GOAL #3   Title  He will be able to supinate RT wrist    +15 degrees    Time  4    Period  Weeks    Status  Achieved      PT SHORT TERM GOAL #4   Title  Active RT wrist extension to 15 degrees    Time  4    Period  Weeks    Status  Achieved  PT Long Term Goals - 12/22/19 1022      PT LONG TERM GOAL #1   Title  He will be independent with all hEP    Time  12    Period  Weeks    Status  On-going      PT LONG TERM GOAL #2   Title  He will be able to reach overhead with  5#   to access overhead cabinets.    Baseline  8# today    Time  12    Period  Weeks    Status  Achieved      PT LONG TERM GOAL #3   Title  He will be able to open bottles and jars,  and open doors with min to no pain.    Baseline  has been working on this. progressing    Time  12    Period  Weeks    Status  On-going      PT LONG TERM GOAL #4   Title  He will return to driving and normal work activity    Baseline  can perform all job duties and drive    Time  12    Period  Weeks    Status  Achieved      PT LONG TERM GOAL #5   Title  FOTO score will decreased to 44% limited or better to demo percieved functional improvement    Baseline  47% limited    Time  12    Period  Weeks    Status  On-going            Plan - 12/22/19 1035    Clinical Impression Statement  Pt reports he has returned to driving and he can perform all job duties with some modifications, otherwise he is not limited at work. LTG#4 met. He can open doors and is still working on twisting open bottles and jars.    PT Next Visit Plan  strengthening  and ROM    PT Home Exercise Plan  wrist flex/ext/supination/pronation elbow flex/ext, shoulder flexion /ER, added towel squeeze, Pronation/ Supination with light handle, DIP self assisted flexion and extension.       Patient will benefit from skilled therapeutic intervention in order to improve the following deficits and impairments:     Visit Diagnosis: Closed fracture of left wrist with routine healing, subsequent encounter  Pain in right wrist  Stiffness of right wrist, not elsewhere classified  Closed fracture of left elbow with routine healing, subsequent encounter  Stiffness of left elbow, not elsewhere classified  Pain in left elbow     Problem List Patient Active Problem List   Diagnosis Date Noted  . Laceration of right hand 07/19/2019  . Fall from balcony 07/16/2019  . Closed fracture of right distal radius and ulna, initial encounter 07/16/2019  . Closed nondisplaced fracture of right clavicle 07/16/2019  . Liver laceration, closed, initial encounter 07/16/2019  . Multiple rib fractures 07/16/2019    Dorene Ar, PTA 12/22/2019, 11:04 AM  [place], [place], [address] Phone: [phone]   Fax:  [phone]  Name: Edward Wall MRN: [id number] Date of Birth: 1985/12/01

## 2019-12-29 ENCOUNTER — Encounter: Payer: Self-pay | Admitting: Physical Therapy

## 2019-12-29 ENCOUNTER — Other Ambulatory Visit: Payer: Self-pay

## 2019-12-29 ENCOUNTER — Ambulatory Visit: Payer: No Typology Code available for payment source | Admitting: Physical Therapy

## 2019-12-29 DIAGNOSIS — M25631 Stiffness of right wrist, not elsewhere classified: Secondary | ICD-10-CM

## 2019-12-29 DIAGNOSIS — M25622 Stiffness of left elbow, not elsewhere classified: Secondary | ICD-10-CM

## 2019-12-29 DIAGNOSIS — M25522 Pain in left elbow: Secondary | ICD-10-CM

## 2019-12-29 DIAGNOSIS — M25531 Pain in right wrist: Secondary | ICD-10-CM

## 2019-12-29 DIAGNOSIS — S62102D Fracture of unspecified carpal bone, left wrist, subsequent encounter for fracture with routine healing: Secondary | ICD-10-CM

## 2019-12-29 DIAGNOSIS — S42402D Unspecified fracture of lower end of left humerus, subsequent encounter for fracture with routine healing: Secondary | ICD-10-CM

## 2019-12-29 NOTE — Therapy (Addendum)
Edward Wall, [place], [address] Phone: [phone]   Fax:  [phone]  Physical Therapy Treatment/Discharge  Patient Details  Name: Edward Wall MRN: [id number] Date of Birth: 05/30/1986 Referring Provider (PT): Patrecia Pace   PA-C   Encounter Date: 12/29/2019  PT End of Session - 12/29/19 0948    Visit Number  20    Number of Visits  24    Date for PT Re-Evaluation  01/07/20    Authorization Type  self pay    PT Start Time  0932    PT Stop Time  1010    PT Time Calculation (min)  38 min       History reviewed. No pertinent past medical history.  Past Surgical History:  Procedure Laterality Date  . ORIF WRIST FRACTURE Right 07/16/2019   Procedure: OPEN REDUCTION INTERNAL FIXATION (ORIF) WRIST FRACTURE;  Surgeon: Shona Needles, MD;  Location: [facility];  Service: Orthopedics;  Laterality: Right;    There were no vitals filed for this visit.  Subjective Assessment - 12/29/19 0941    Subjective  Wrists are a lot better.    Currently in Pain?  No/denies                       [facility] Adult PT Treatment/Exercise - 12/29/19 0001      Shoulder Exercises: ROM/Strengthening   UBE (Upper Arm Bike)  L4  8 minutes half eay way     Other ROM/Strengthening Exercises  shoulder lateral raise 5# , forward raise thumb up 5# x 20     Other ROM/Strengthening Exercises  over head press 9# x 20, hammer curls 9# x 20 each       Hand Exercises   Other Hand Exercises  Digi Grip x 10 with 5 sec hold , slow release       Wrist Exercises   Wrist Flexion  Right;Left;20 reps    Bar Weights/Barbell (Wrist Flexion)  5 lbs    Wrist Extension  Right;Left;20 reps    Bar Weights/Barbell (Wrist Extension)  5 lbs    Wrist Radial Deviation  Right;Left;20 reps    Bar Weights/Barbell (Radial Deviation)  5 lbs    Other wrist exercises  velcro pad finger truns in wide velcro strip, wrist flexion and extension handle  turns on wide velcro    finger twisting              PT Short Term Goals - 12/01/19 1540      PT SHORT TERM GOAL #1   Title  He will be indpendent with all initial HEP    Time  4    Period  Weeks    Status  Achieved      PT SHORT TERM GOAL #2   Title  He will improve LT elbow flexion and extension to equal RT active    Time  4    Period  Weeks    Status  Achieved      PT SHORT TERM GOAL #3   Title  He will be able to supinate RT wrist    +15 degrees    Time  4    Period  Weeks    Status  Achieved      PT SHORT TERM GOAL #4   Title  Active RT wrist extension to 15 degrees    Time  4    Period  Weeks    Status  Achieved        PT Long Term Goals - 12/22/19 1022      PT LONG TERM GOAL #1   Title  He will be independent with all hEP    Time  12    Period  Weeks    Status  On-going      PT LONG TERM GOAL #2   Title  He will be able to reach overhead with  5#   to access overhead cabinets.    Baseline  8# today    Time  12    Period  Weeks    Status  Achieved      PT LONG TERM GOAL #3   Title  He will be able to open bottles and jars,  and open doors with min to no pain.    Baseline  has been working on this. progressing    Time  12    Period  Weeks    Status  On-going      PT LONG TERM GOAL #4   Title  He will return to driving and normal work activity    Baseline  can perform all job duties and drive    Time  12    Period  Weeks    Status  Achieved      PT LONG TERM GOAL #5   Title  FOTO score will decreased to 44% limited or better to demo percieved functional improvement    Baseline  47% limited    Time  12    Period  Weeks    Status  On-going            Plan - 12/29/19 0948    Clinical Impression Statement  Pt reports his job duties are becoming easier and feels he needs less modifications to complete tasks.    PT Next Visit Plan  review and DC, recheck FOTO    PT Home Exercise Plan  wrist flex/ext/supination/pronation elbow  flex/ext, shoulder flexion /ER, added towel squeeze, Pronation/ Supination with light handle, DIP self assisted flexion and extension.       Patient will benefit from skilled therapeutic intervention in order to improve the following deficits and impairments:  Pain, Decreased range of motion, Decreased activity tolerance, Decreased strength, Impaired UE functional use  Visit Diagnosis: Closed fracture of left wrist with routine healing, subsequent encounter  Pain in right wrist  Stiffness of right wrist, not elsewhere classified  Closed fracture of left elbow with routine healing, subsequent encounter  Stiffness of left elbow, not elsewhere classified  Pain in left elbow     Problem List Patient Active Problem List   Diagnosis Date Noted  . Laceration of right hand 07/19/2019  . Fall from balcony 07/16/2019  . Closed fracture of right distal radius and ulna, initial encounter 07/16/2019  . Closed nondisplaced fracture of right clavicle 07/16/2019  . Liver laceration, closed, initial encounter 07/16/2019  . Multiple rib fractures 07/16/2019    Dorene Ar , PTA 12/29/2019, 10:18 AM  [place], [place], [address] Phone: [phone]   Fax:  [phone]  Name: Edward Wall MRN: [id number] Date of Birth: April 20, 1986  PHYSICAL THERAPY DISCHARGE SUMMARY  Visits from Start of Care: 20  Current functional level related to goals / functional outcomes: See above. He  missed his last scheduled appointment coming in on the wrong day and he reported he felt he was doing well and was ready  for discharge.   Remaining deficits: See above. He has conitnued stiffness of RT hand and wrist and still limited into grip strength and carrying items at home and work   Education / Equipment: HEP Plan: Patient agrees to discharge.  Patient goals were partially met. Patient is being discharged due to  being pleased with the current functional level.  ?????    Pearson Forster PT 01/06/20

## 2020-01-04 ENCOUNTER — Ambulatory Visit: Payer: No Typology Code available for payment source

## 2020-07-12 ENCOUNTER — Other Ambulatory Visit: Payer: No Typology Code available for payment source

## 2020-07-12 DIAGNOSIS — Z20822 Contact with and (suspected) exposure to covid-19: Secondary | ICD-10-CM

## 2020-07-14 LAB — SARS-COV-2, NAA 2 DAY TAT

## 2020-07-14 LAB — NOVEL CORONAVIRUS, NAA: SARS-CoV-2, NAA: DETECTED — AB

## 2020-07-15 ENCOUNTER — Telehealth (HOSPITAL_COMMUNITY): Payer: Self-pay | Admitting: Adult Health

## 2020-07-15 NOTE — Telephone Encounter (Signed)
Called and LMOM regarding monoclonal antibody treatment for COVID 19 given to those who are at risk for complications and/or hospitalization of the virus.  Patient meets criteria based on: tobacco cuse  Call back number given: [phone]  My chart message: sent  Lillard Anes, NP

## 2020-07-21 ENCOUNTER — Other Ambulatory Visit: Payer: No Typology Code available for payment source

## 2020-07-21 DIAGNOSIS — Z20822 Contact with and (suspected) exposure to covid-19: Secondary | ICD-10-CM

## 2020-07-22 LAB — NOVEL CORONAVIRUS, NAA: SARS-CoV-2, NAA: NOT DETECTED

## 2020-07-22 LAB — SARS-COV-2, NAA 2 DAY TAT

## 2020-10-04 ENCOUNTER — Other Ambulatory Visit: Payer: Self-pay

## 2020-10-04 ENCOUNTER — Emergency Department (HOSPITAL_COMMUNITY): Payer: Self-pay

## 2020-10-04 ENCOUNTER — Emergency Department (HOSPITAL_COMMUNITY)
Admission: EM | Admit: 2020-10-04 | Discharge: 2020-10-04 | Disposition: A | Discharge status: Home / Self Care | Payer: Self-pay | Attending: Emergency Medicine | Admitting: Emergency Medicine

## 2020-10-04 DIAGNOSIS — G9009 Other idiopathic peripheral autonomic neuropathy: Secondary | ICD-10-CM | POA: Insufficient documentation

## 2020-10-04 DIAGNOSIS — G589 Mononeuropathy, unspecified: Secondary | ICD-10-CM

## 2020-10-04 DIAGNOSIS — F17200 Nicotine dependence, unspecified, uncomplicated: Secondary | ICD-10-CM | POA: Insufficient documentation

## 2020-10-04 NOTE — ED Provider Notes (Signed)
[facility]-EMERGENCY DEPT Provider Note   CSN: [id number] Arrival date & time: 10/04/20  1724     History Chief Complaint  Patient presents with  . Wrist Pain    Wrist pain, decreased mobility of right wrist     Lebron Nauert is a 34 y.o. male.  Patient presents with right upper extremity wrist drop.  He states he has a history of prior fracture and injury from several years ago of the right wrist with orthopedic repair.  He was at his baseline which is intermittent pain in the area when he took a nap yesterday around 11 AM.  He states when he woke up he felt increased numbness of the right hand as well as weakness of the right wrist.  Denies new fall or trauma denies fever vomiting cough or diarrhea.        No past medical history on file.  Patient Active Problem List   Diagnosis Date Noted  . Laceration of right hand 07/19/2019  . Fall from balcony 07/16/2019  . Closed fracture of right distal radius and ulna, initial encounter 07/16/2019  . Closed nondisplaced fracture of right clavicle 07/16/2019  . Liver laceration, closed, initial encounter 07/16/2019  . Multiple rib fractures 07/16/2019    Past Surgical History:  Procedure Laterality Date  . ORIF WRIST FRACTURE Right 07/16/2019   Procedure: OPEN REDUCTION INTERNAL FIXATION (ORIF) WRIST FRACTURE;  Surgeon: Roby Lofts, MD;  Location: [facility];  Service: Orthopedics;  Laterality: Right;       No family history on file.  Social History   Tobacco Use  . Smoking status: Current Every Day Smoker  . Smokeless tobacco: Never Used  Substance Use Topics  . Alcohol use: Yes  . Drug use: No    Home Medications Prior to Admission medications   Medication Sig Start Date End Date Taking? Authorizing Provider  ibuprofen (ADVIL) 800 MG tablet Take 800 mg by mouth every 8 (eight) hours as needed for moderate pain.   Yes [provider]  cephALEXin (KEFLEX) 500 MG capsule Take 1 capsule  (500 mg total) by mouth 2 (two) times daily. Patient not taking: No sig reported 07/05/17   Isa Rankin, MD  oxyCODONE (OXY IR/ROXICODONE) 5 MG immediate release tablet Take 1 tablet (5 mg total) by mouth every 6 (six) hours as needed for moderate pain (pain not controlled with tylenol). Patient not taking: Reported on 10/04/2020 07/18/19   Abigail Miyamoto, MD    Allergies    Patient has no known allergies.  Review of Systems   Review of Systems  Constitutional: Negative for fever.  HENT: Negative for ear pain and sore throat.   Eyes: Negative for pain.  Respiratory: Negative for cough.   Cardiovascular: Negative for chest pain.  Gastrointestinal: Negative for abdominal pain.  Genitourinary: Negative for flank pain.  Musculoskeletal: Negative for back pain.  Skin: Negative for color change and rash.  Neurological: Negative for syncope.  All other systems reviewed and are negative.   Physical Exam Updated Vital Signs BP 130/88 (BP Location: Left Arm)   Pulse 75   Temp 98 F (36.7 C) (Oral)   Resp 18   Ht 5\' 9"  (1.753 m)   Wt 61.2 kg   SpO2 100%   BMI 19.94 kg/m   Physical Exam Constitutional:      General: He is not in acute distress.    Appearance: He is well-developed.  HENT:     Head: Normocephalic.  Mouth/Throat:     Mouth: Mucous membranes are moist.  Cardiovascular:     Rate and Rhythm: Normal rate.  Pulmonary:     Effort: Pulmonary effort is normal.  Abdominal:     Palpations: Abdomen is soft.  Musculoskeletal:     Right lower leg: No edema.     Left lower leg: No edema.     Comments: Right upper extremity shows signs of chronic muscle atrophy.  No significant tenderness palpation of the right upper extremity.  Distal radial pulses are 2+ intact.  Sensation grossly intact with feels pins-and-needles diffusely per patient.  Patient has weakness in wrist extension as well as thumb extension.  Has intact flexion of his fingers however.  No  weakness of the elbow or shoulder.  Skin:    General: Skin is warm.     Capillary Refill: Capillary refill takes less than 2 seconds.  Neurological:     General: No focal deficit present.     Mental Status: He is alert.     ED Results / Procedures / Treatments   Labs (all labs ordered are listed, but only abnormal results are displayed) Labs Reviewed - No data to display  EKG None  Radiology DG Wrist Complete Right  Result Date: 10/04/2020 CLINICAL DATA:  Wrist numbness. Right wrist pain and decreased mobility. Prior fracture and surgical repair. EXAM: RIGHT WRIST - COMPLETE 3+ VIEW COMPARISON:  Most recent radiograph 07/16/2019 FINDINGS: Volar plate and multi screw fixation of previous distal radius fracture. The fracture has healed. The hardware is intact. There is no periprosthetic lucency. No acute fracture. Remote ulna styloid fracture. Remote fifth metacarpal fracture. No erosion or bony destruction. Soft tissues are unremarkable. IMPRESSION: 1. Volar plate and screw fixation of previous distal radius fracture. The hardware is intact. No acute osseous abnormality. 2. Remote ulna styloid fracture.  Remote fifth metacarpal fracture. Electronically Signed   By: Narda Rutherford M.D.   On: 10/04/2020 18:27    Procedures Procedures (including critical care time)  Medications Ordered in ED Medications - No data to display  ED Course  I have reviewed the triage vital signs and the nursing notes.  Pertinent labs & imaging results that were available during my care of the patient were reviewed by me and considered in my medical decision making (see chart for details).    MDM Rules/Calculators/A&P                          Please suspect radial nerve palsy with wrist drop..  Doubt central deficit given no weakness of the elbow or shoulder. Patient has an orthopedist who repaired his right wrist about a year and a half ago.  Advised to follow-up with his orthopedic surgeon  tomorrow.  Right extremity placed in a splint.  Neurovascular catheter placement.  Advised immediate return for new pain or fevers.  Worsening symptoms.  Or any additional concerns.  Final Clinical Impression(s) / ED Diagnoses Final diagnoses:  Peripheral nerve palsy    Rx / DC Orders ED Discharge Orders    None       Cheryll Cockayne, MD 10/04/20 [phone]

## 2020-10-04 NOTE — ED Notes (Signed)
Splint applied by Milon Dikes

## 2020-10-04 NOTE — ED Notes (Signed)
Ortho tech consulted to apply volar splint to right wrist.

## 2020-10-04 NOTE — ED Triage Notes (Signed)
34 yo male presents today c/o right wrist pain and decreased mobility or wrist and digits of the right hand x 2 days. Pt states he had repair of the right wrist with placement of a metal plate one year ago status post a traumatic break. Pt states he has had some problems with some pain periodically with full use of the hand until two days ago. Pt states " my wrist feels like I fell asleep on it but it just hasn't woken up since." Pt endorse numbness of the wrist and hand and digits on the right side and decreased ability to move the joint.

## 2020-10-04 NOTE — Discharge Instructions (Addendum)
Call your primary care doctor or specialist as discussed in the next 2-3 days.   Return immediately back to the ER if:  Your symptoms worsen within the next 12-24 hours. You develop new symptoms such as new fevers, persistent vomiting, new pain, shortness of breath, or new weakness or numbness, or if you have any other concerns.  

## 2020-11-04 ENCOUNTER — Other Ambulatory Visit: Payer: Self-pay

## 2020-11-04 ENCOUNTER — Encounter (HOSPITAL_COMMUNITY): Payer: Self-pay | Admitting: Emergency Medicine

## 2020-11-04 ENCOUNTER — Emergency Department (HOSPITAL_COMMUNITY)
Admission: EM | Admit: 2020-11-04 | Discharge: 2020-11-04 | Disposition: A | Discharge status: Home / Self Care | Payer: No Typology Code available for payment source | Attending: Emergency Medicine | Admitting: Emergency Medicine

## 2020-11-04 DIAGNOSIS — S80212A Abrasion, left knee, initial encounter: Secondary | ICD-10-CM | POA: Insufficient documentation

## 2020-11-04 DIAGNOSIS — F17200 Nicotine dependence, unspecified, uncomplicated: Secondary | ICD-10-CM | POA: Insufficient documentation

## 2020-11-04 DIAGNOSIS — Z23 Encounter for immunization: Secondary | ICD-10-CM | POA: Insufficient documentation

## 2020-11-04 DIAGNOSIS — S0181XA Laceration without foreign body of other part of head, initial encounter: Secondary | ICD-10-CM | POA: Insufficient documentation

## 2020-11-04 HISTORY — DX: Lesion of radial nerve, right upper limb: G56.31

## 2020-11-04 MED ORDER — LIDOCAINE-EPINEPHRINE 2 %-1:100000 IJ SOLN
20.0000 mL | Freq: Once | INTRAMUSCULAR | Status: AC
  Administered 2020-11-04: 20 mL
  Filled 2020-11-04: qty 1

## 2020-11-04 MED ORDER — TETANUS-DIPHTH-ACELL PERTUSSIS 5-2.5-18.5 LF-MCG/0.5 IM SUSY
0.5000 mL | PREFILLED_SYRINGE | Freq: Once | INTRAMUSCULAR | Status: AC
  Administered 2020-11-04: 0.5 mL via INTRAMUSCULAR
  Filled 2020-11-04: qty 0.5

## 2020-11-04 NOTE — Discharge Instructions (Addendum)
Please clean this wound daily with gentle soap and water and apply Neosporin over for protection.  You have absorbable suture.  Take over the counter tylenol or ibuprofen as needed for pain.
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
## 2021-07-01 ENCOUNTER — Other Ambulatory Visit: Payer: Self-pay

## 2021-07-01 ENCOUNTER — Encounter (HOSPITAL_COMMUNITY): Payer: Self-pay

## 2021-07-01 ENCOUNTER — Inpatient Hospital Stay (HOSPITAL_COMMUNITY)
Admission: EM | Admit: 2021-07-01 | Discharge: 2021-07-05 | DRG: 439 | Disposition: A | Discharge status: Home / Self Care | Payer: Self-pay | Attending: Internal Medicine | Admitting: Internal Medicine

## 2021-07-01 DIAGNOSIS — K863 Pseudocyst of pancreas: Secondary | ICD-10-CM | POA: Diagnosis present

## 2021-07-01 DIAGNOSIS — N12 Tubulo-interstitial nephritis, not specified as acute or chronic: Secondary | ICD-10-CM

## 2021-07-01 DIAGNOSIS — Z20822 Contact with and (suspected) exposure to covid-19: Secondary | ICD-10-CM | POA: Diagnosis present

## 2021-07-01 DIAGNOSIS — D7589 Other specified diseases of blood and blood-forming organs: Secondary | ICD-10-CM | POA: Diagnosis present

## 2021-07-01 DIAGNOSIS — K760 Fatty (change of) liver, not elsewhere classified: Secondary | ICD-10-CM | POA: Diagnosis present

## 2021-07-01 DIAGNOSIS — E876 Hypokalemia: Secondary | ICD-10-CM | POA: Diagnosis present

## 2021-07-01 DIAGNOSIS — K859 Acute pancreatitis without necrosis or infection, unspecified: Secondary | ICD-10-CM | POA: Diagnosis present

## 2021-07-01 DIAGNOSIS — F1010 Alcohol abuse, uncomplicated: Secondary | ICD-10-CM | POA: Diagnosis present

## 2021-07-01 DIAGNOSIS — E871 Hypo-osmolality and hyponatremia: Secondary | ICD-10-CM | POA: Diagnosis present

## 2021-07-01 DIAGNOSIS — F17210 Nicotine dependence, cigarettes, uncomplicated: Secondary | ICD-10-CM | POA: Diagnosis present

## 2021-07-01 DIAGNOSIS — K852 Alcohol induced acute pancreatitis without necrosis or infection: Principal | ICD-10-CM | POA: Diagnosis present

## 2021-07-01 DIAGNOSIS — Z806 Family history of leukemia: Secondary | ICD-10-CM

## 2021-07-01 LAB — CBC WITH DIFFERENTIAL/PLATELET
Abs Immature Granulocytes: 0.09 10*3/uL — ABNORMAL HIGH (ref 0.00–0.07)
Basophils Absolute: 0 10*3/uL (ref 0.0–0.1)
Basophils Relative: 0 %
Eosinophils Absolute: 0 10*3/uL (ref 0.0–0.5)
Eosinophils Relative: 0 %
HCT: 42.7 % (ref 39.0–52.0)
Hemoglobin: 15.2 g/dL (ref 13.0–17.0)
Immature Granulocytes: 1 %
Lymphocytes Relative: 7 %
Lymphs Abs: 1 10*3/uL (ref 0.7–4.0)
MCH: 36 pg — ABNORMAL HIGH (ref 26.0–34.0)
MCHC: 35.6 g/dL (ref 30.0–36.0)
MCV: 101.2 fL — ABNORMAL HIGH (ref 80.0–100.0)
Monocytes Absolute: 1.6 10*3/uL — ABNORMAL HIGH (ref 0.1–1.0)
Monocytes Relative: 12 %
Neutro Abs: 11.1 10*3/uL — ABNORMAL HIGH (ref 1.7–7.7)
Neutrophils Relative %: 80 %
Platelets: 192 10*3/uL (ref 150–400)
RBC: 4.22 MIL/uL (ref 4.22–5.81)
RDW: 13.3 % (ref 11.5–15.5)
WBC: 13.8 10*3/uL — ABNORMAL HIGH (ref 4.0–10.5)
nRBC: 0 % (ref 0.0–0.2)

## 2021-07-01 LAB — COMPREHENSIVE METABOLIC PANEL
ALT: 27 U/L (ref 0–44)
AST: 30 U/L (ref 15–41)
Albumin: 3.8 g/dL (ref 3.5–5.0)
Alkaline Phosphatase: 94 U/L (ref 38–126)
Anion gap: 14 (ref 5–15)
BUN: 5 mg/dL — ABNORMAL LOW (ref 6–20)
CO2: 25 mmol/L (ref 22–32)
Calcium: 9.5 mg/dL (ref 8.9–10.3)
Chloride: 94 mmol/L — ABNORMAL LOW (ref 98–111)
Creatinine, Ser: 0.66 mg/dL (ref 0.61–1.24)
GFR, Estimated: >60 mL/min (ref >60)
Glucose, Bld: 127 mg/dL — ABNORMAL HIGH (ref 70–99)
Potassium: 3.4 mmol/L — ABNORMAL LOW (ref 3.5–5.1)
Sodium: 133 mmol/L — ABNORMAL LOW (ref 135–145)
Total Bilirubin: 1.8 mg/dL — ABNORMAL HIGH (ref 0.3–1.2)
Total Protein: 7.1 g/dL (ref 6.5–8.1)

## 2021-07-01 LAB — LIPASE, BLOOD: Lipase: 24 U/L (ref 11–51)

## 2021-07-01 MED ORDER — ONDANSETRON HCL 4 MG/2ML IJ SOLN
4.0000 mg | Freq: Once | INTRAMUSCULAR | Status: AC
  Administered 2021-07-01: 4 mg via INTRAVENOUS
  Filled 2021-07-01: qty 2

## 2021-07-01 MED ORDER — ONDANSETRON 4 MG PO TBDP: 4.0000 mg | ORAL_TABLET | Freq: Once | ORAL | Status: DC

## 2021-07-01 MED ORDER — SODIUM CHLORIDE 0.9 % IV BOLUS
1000.0000 mL | Freq: Once | INTRAVENOUS | Status: AC
  Administered 2021-07-01: 1000 mL via INTRAVENOUS

## 2021-07-01 MED ORDER — FENTANYL CITRATE PF 50 MCG/ML IJ SOSY
50.0000 ug | PREFILLED_SYRINGE | Freq: Once | INTRAMUSCULAR | Status: AC
  Administered 2021-07-01: 50 ug via INTRAVENOUS
  Filled 2021-07-01: qty 1

## 2021-07-01 NOTE — ED Triage Notes (Signed)
Pt c/o abdominal pain, nausea, vomiting, and diarrhea since Friday

## 2021-07-01 NOTE — ED Provider Notes (Signed)
Patient isEmergency Medicine Provider Triage Evaluation Note  Edward Wall , a 35 y.o. male  was evaluated in triage.  Pt complains of nausea, vomiting, and diarrhea associated with generalized abdominal pain x3 days.  He admits to 1 episode of nonbloody, nonbilious emesis.  No sick contacts known COVID exposures.  Review of Systems  Positive: Nausea, vomiting, diarrhea, abdominal pain Negative: fever  Physical Exam  BP 132/86   Pulse 99   Temp 99.1 F (37.3 C)   Resp 19   SpO2 99%  Gen:   Awake, no distress   Resp:  Normal effort  MSK:   Moves extremities without difficulty  Other:  Generalized abdominal pain with voluntary guarding.  Medical Decision Making  Medically screening exam initiated at 6:25 PM.  Appropriate orders placed.  Gregroy Dombkowski was informed that the remainder of the evaluation will be completed by another provider, this initial triage assessment does not replace that evaluation, and the importance of remaining in the ED until their evaluation is complete.  Labs COVID test Zofran given in triage   Jesusita Oka 07/01/21 1827    Glendora Score, MD 07/01/21 [phone]

## 2021-07-01 NOTE — ED Provider Notes (Signed)
[facility]   CSN: [id number] Arrival date & time: 07/01/21  1812     History Chief Complaint  Patient presents with   Abdominal Pain   Nausea    Edward Wall is a 35 y.o. male.  Patient presents with a 3-day history of diffuse low back pain.  States he had similar pain about a week ago which resolved on its own.  Denies any fall or injury.  States the pain is constant and radiates across his abdomen.  He has had 1 episode of vomiting today and 1 episode 2 days ago.  Also with episode of diarrhea.  Does not think he had a fever at home but has had chills.  Denies any pain with urination or blood in the urine.  Denies any chest pain or shortness of breath.  Denies any cough, runny nose, sore throat, rhinorrhea, sick contacts or recent travel.  He denies any history of back surgery.  He denies any bowel or bladder incontinence.  He does not think he had any fever but has not checked his temperature.  No weakness or numbness in his legs.  No chest pain or shortness of breath.  He states the pain is worse with movement and position change.  There is no radiation of the pain down his legs.  There is no bowel or bladder incontinence.  There is no history of IV drug abuse or cancer.  The history is provided by the patient.  Abdominal Pain Associated symptoms: chills, diarrhea, fatigue, nausea and vomiting   Associated symptoms: no chest pain, no cough, no dysuria, no fever, no hematuria and no shortness of breath       Past Medical History:  Diagnosis Date   Radial nerve palsy, right     Patient Active Problem List   Diagnosis Date Noted   Laceration of right hand 07/19/2019   Fall from balcony 07/16/2019   Closed fracture of right distal radius and ulna, initial encounter 07/16/2019   Closed nondisplaced fracture of right clavicle 07/16/2019   Liver laceration, closed, initial encounter 07/16/2019   Multiple rib fractures 07/16/2019     Past Surgical History:  Procedure Laterality Date   ORIF WRIST FRACTURE Right 07/16/2019   Procedure: OPEN REDUCTION INTERNAL FIXATION (ORIF) WRIST FRACTURE;  Surgeon: Roby Lofts, MD;  Location: [facility] OR;  Service: Orthopedics;  Laterality: Right;       History reviewed. No pertinent family history.  Social History   Tobacco Use   Smoking status: Every Day    Packs/day: 0.50    Types: Cigarettes   Smokeless tobacco: Never  Substance Use Topics   Alcohol use: Yes   Drug use: Yes    Frequency: 7.0 times per week    Types: Marijuana    Home Medications Prior to Admission medications   Medication Sig Start Date End Date Taking? Authorizing Provider  cephALEXin (KEFLEX) 500 MG capsule Take 1 capsule (500 mg total) by mouth 2 (two) times daily. Patient not taking: No sig reported 07/05/17   Isa Rankin, MD  ibuprofen (ADVIL) 800 MG tablet Take 800 mg by mouth every 8 (eight) hours as needed for moderate pain.    [provider]  oxyCODONE (OXY IR/ROXICODONE) 5 MG immediate release tablet Take 1 tablet (5 mg total) by mouth every 6 (six) hours as needed for moderate pain (pain not controlled with tylenol). Patient not taking: Reported on 10/04/2020 07/18/19   Abigail Miyamoto, MD  Allergies    Patient has no known allergies.  Review of Systems   Review of Systems  Constitutional:  Positive for activity change, appetite change, chills and fatigue. Negative for fever.  HENT:  Negative for congestion and rhinorrhea.   Respiratory:  Negative for cough, chest tightness and shortness of breath.   Cardiovascular:  Negative for chest pain.  Gastrointestinal:  Positive for abdominal pain, diarrhea, nausea and vomiting.  Genitourinary:  Negative for dysuria and hematuria.  Musculoskeletal:  Positive for arthralgias, back pain and myalgias.  Skin:  Negative for rash.  Neurological:  Negative for dizziness, weakness and headaches.   all other systems are  negative except as noted in the HPI and PMH.   Physical Exam Updated Vital Signs BP 133/90   Pulse 75   Temp 99.1 F (37.3 C)   Resp 17   SpO2 99%   Physical Exam Vitals and nursing note reviewed.  Constitutional:      General: He is not in acute distress.    Appearance: He is well-developed. He is not ill-appearing.  HENT:     Head: Normocephalic and atraumatic.     Nose: No congestion or rhinorrhea.     Mouth/Throat:     Pharynx: No oropharyngeal exudate.  Eyes:     Conjunctiva/sclera: Conjunctivae normal.     Pupils: Pupils are equal, round, and reactive to light.  Neck:     Comments: No meningismus. Cardiovascular:     Rate and Rhythm: Normal rate and regular rhythm.     Heart sounds: Normal heart sounds. No murmur heard. Pulmonary:     Effort: Pulmonary effort is normal. No respiratory distress.     Breath sounds: Normal breath sounds.  Chest:     Chest wall: No tenderness.  Abdominal:     Palpations: Abdomen is soft.     Tenderness: There is abdominal tenderness. There is guarding. There is no rebound.     Comments: Diffuse tenderness with guarding  Musculoskeletal:        General: Tenderness present. Normal range of motion.     Cervical back: Normal range of motion and neck supple.     Comments: Paraspinal lumbar tenderness. 5/5 strength in bilateral lower extremities. Ankle plantar and dorsiflexion intact. Great toe extension intact bilaterally. +2 DP and PT pulses. +2 patellar reflexes bilaterally. Antalgic gait  Skin:    General: Skin is warm.  Neurological:     Mental Status: He is alert and oriented to person, place, and time.     Cranial Nerves: No cranial nerve deficit.     Motor: No abnormal muscle tone.     Coordination: Coordination normal.     Comments: No ataxia on finger to nose bilaterally. No pronator drift. 5/5 strength throughout. CN 2-12 intact.Equal grip strength. Sensation intact.   Psychiatric:        Behavior: Behavior normal.    ED  Results / Procedures / Treatments   Labs (all labs ordered are listed, but only abnormal results are displayed) Labs Reviewed  CBC WITH DIFFERENTIAL/PLATELET - Abnormal; Notable for the following components:      Result Value   WBC 13.8 (*)    MCV 101.2 (*)    MCH 36.0 (*)    Neutro Abs 11.1 (*)    Monocytes Absolute 1.6 (*)    Abs Immature Granulocytes 0.09 (*)    All other components within normal limits  COMPREHENSIVE METABOLIC PANEL - Abnormal; Notable for the following components:   Sodium  133 (*)    Potassium 3.4 (*)    Chloride 94 (*)    Glucose, Bld 127 (*)    BUN 5 (*)    Total Bilirubin 1.8 (*)    All other components within normal limits  URINALYSIS, ROUTINE W REFLEX MICROSCOPIC - Abnormal; Notable for the following components:   Color, Urine YELLOW (*)    APPearance HAZY (*)    Bilirubin Urine SMALL (*)    Ketones, ur 80 (*)    Protein, ur 30 (*)    Nitrite POSITIVE (*)    Bacteria, UA RARE (*)    All other components within normal limits  RESP PANEL BY RT-PCR (FLU A&B, COVID) ARPGX2  URINE CULTURE  LIPASE, BLOOD    EKG EKG Interpretation  Date/Time:  Monday July 02 2021 03:23:36 EDT Ventricular Rate:  71 PR Interval:  108 QRS Duration: 88 QT Interval:  418 QTC Calculation: 455 R Axis:   73 Text Interpretation: Sinus rhythm Short PR interval Otherwise within normal limits No old tracing to compare Confirmed by Dione Booze ([id number]) on 07/02/2021 3:50:53 AM  Radiology CT ABDOMEN PELVIS W CONTRAST  Result Date: 07/02/2021 CLINICAL DATA:  Abdominal pain, nausea/vomiting/diarrhea EXAM: CT ABDOMEN AND PELVIS WITH CONTRAST TECHNIQUE: Multidetector CT imaging of the abdomen and pelvis was performed using the standard protocol following bolus administration of intravenous contrast. CONTRAST:  37mL OMNIPAQUE IOHEXOL 350 MG/ML SOLN COMPARISON:  07/16/2019 FINDINGS: Motion degraded images. Lower chest: Mild left basilar atelectasis. Hepatobiliary: Liver is within  normal limits. Gallbladder is unremarkable. No intrahepatic or extrahepatic ductal dilatation. Pancreas: 3.9 x 3.0 cm thick-walled fluid collection along the pancreatic tail with peripancreatic inflammatory changes/fluid (series 2/image 22), favoring acute or subacute pancreatitis with a pancreatic pseudocyst. Spleen: Within normal limits. Adrenals/Urinary Tract: Adrenal glands are within normal limits. Kidneys are notable for excretory contrast in the bilateral renal collecting systems. No hydronephrosis. Layering contrast in the bladder, otherwise within normal limits. Stomach/Bowel: Stomach is within normal limits. No evidence of bowel obstruction. Normal appendix (coronal image 78). Wall thickening with pericolonic inflammatory changes involving the proximal descending colon in the left upper abdomen (series 2/image 27), adjacent to the pancreatic tail, favored to be secondary to the patient's recent acute pancreatitis. Vascular/Lymphatic: No evidence of abdominal aortic aneurysm. No suspicious abdominopelvic lymphadenopathy. Reproductive: Prostate is unremarkable. Other: No abdominopelvic ascites. No free air. Musculoskeletal: Visualized osseous structures are within normal limits. IMPRESSION: Normal appendix. Suspected acute/subacute pancreatitis with a 3.9 cm pseudocyst in the pancreatic tail. Adjacent pericolonic inflammatory changes/wall thickening involving the proximal descending colon, favored to be secondary to the pancreatic inflammation. Consider follow-up CT or MR abdomen with/without contrast in 4-6 weeks to document improvement/resolution. Electronically Signed   By: Charline Bills M.D.   On: 07/02/2021 01:39    Procedures Procedures   Medications Ordered in ED Medications  ondansetron (ZOFRAN-ODT) disintegrating tablet 4 mg (has no administration in time range)  sodium chloride 0.9 % bolus 1,000 mL (has no administration in time range)  fentaNYL (SUBLIMAZE) injection 50 mcg (has no  administration in time range)  ondansetron (ZOFRAN) injection 4 mg (has no administration in time range)    ED Course  I have reviewed the triage vital signs and the nursing notes.  Pertinent labs & imaging results that were available during my care of the patient were reviewed by me and considered in my medical decision making (see chart for details).    MDM Rules/Calculators/A&P  Low back pain with abdominal pain and intermittent vomiting and diarrhea for the past 3 days.  Equal strength, sensation, pulses and reflexes.  Low suspicion for cord compression and cauda equina  Temperature 99 but patient feels warmer than this.  Will check rectal temperature.  Urinalysis shows positive nitrate, large ketones and many white cells.  Will send culture and initiate IV Rocephin.  Leukocytosis noted.  CT scan obtained to assess for kidney stone versus appendicitis versus other.  CT shows normal appendix but does show evidence of pancreatitis and colonic inflammation however patient's lipase is normal and LFTs are normal Ketones noted, anion gap is normal.  Unclear whether patient is having true pancreatitis or may be has pyelonephritis.  No kidney stone seen.  Initiated IV antibiotics, IV fluids and pain control.  Given his ongoing symptoms and feeling poorly we will plan admission for continued treatment.  Discussed with Dr. Leafy Half. Final Clinical Impression(s) / ED Diagnoses Final diagnoses:  Acute pancreatitis, unspecified complication status, unspecified pancreatitis type  Pyelonephritis    Rx / DC Orders ED Discharge Orders     None        Kemya Shed, Jeannett Senior, MD 07/02/21 [phone]

## 2021-07-02 ENCOUNTER — Observation Stay (HOSPITAL_COMMUNITY): Payer: Self-pay

## 2021-07-02 ENCOUNTER — Encounter (HOSPITAL_COMMUNITY): Payer: Self-pay

## 2021-07-02 ENCOUNTER — Emergency Department (HOSPITAL_COMMUNITY): Payer: Self-pay

## 2021-07-02 DIAGNOSIS — K859 Acute pancreatitis without necrosis or infection, unspecified: Secondary | ICD-10-CM | POA: Diagnosis present

## 2021-07-02 DIAGNOSIS — K852 Alcohol induced acute pancreatitis without necrosis or infection: Principal | ICD-10-CM

## 2021-07-02 DIAGNOSIS — F17210 Nicotine dependence, cigarettes, uncomplicated: Secondary | ICD-10-CM

## 2021-07-02 DIAGNOSIS — F1010 Alcohol abuse, uncomplicated: Secondary | ICD-10-CM

## 2021-07-02 DIAGNOSIS — K863 Pseudocyst of pancreas: Secondary | ICD-10-CM | POA: Diagnosis present

## 2021-07-02 DIAGNOSIS — E876 Hypokalemia: Secondary | ICD-10-CM

## 2021-07-02 LAB — MAGNESIUM: Magnesium: 1.5 mg/dL — ABNORMAL LOW (ref 1.7–2.4)

## 2021-07-02 LAB — CBC WITH DIFFERENTIAL/PLATELET
Abs Immature Granulocytes: 0.08 10*3/uL — ABNORMAL HIGH (ref 0.00–0.07)
Basophils Absolute: 0 10*3/uL (ref 0.0–0.1)
Basophils Relative: 0 %
Eosinophils Absolute: 0 10*3/uL (ref 0.0–0.5)
Eosinophils Relative: 0 %
HCT: 41.5 % (ref 39.0–52.0)
Hemoglobin: 14.2 g/dL (ref 13.0–17.0)
Immature Granulocytes: 1 %
Lymphocytes Relative: 10 %
Lymphs Abs: 1.5 10*3/uL (ref 0.7–4.0)
MCH: 35.6 pg — ABNORMAL HIGH (ref 26.0–34.0)
MCHC: 34.2 g/dL (ref 30.0–36.0)
MCV: 104 fL — ABNORMAL HIGH (ref 80.0–100.0)
Monocytes Absolute: 1.9 10*3/uL — ABNORMAL HIGH (ref 0.1–1.0)
Monocytes Relative: 13 %
Neutro Abs: 10.8 10*3/uL — ABNORMAL HIGH (ref 1.7–7.7)
Neutrophils Relative %: 76 %
Platelets: 172 10*3/uL (ref 150–400)
RBC: 3.99 MIL/uL — ABNORMAL LOW (ref 4.22–5.81)
RDW: 13.5 % (ref 11.5–15.5)
WBC: 14.3 10*3/uL — ABNORMAL HIGH (ref 4.0–10.5)
nRBC: 0 % (ref 0.0–0.2)

## 2021-07-02 LAB — COMPREHENSIVE METABOLIC PANEL
ALT: 19 U/L (ref 0–44)
AST: 18 U/L (ref 15–41)
Albumin: 3 g/dL — ABNORMAL LOW (ref 3.5–5.0)
Alkaline Phosphatase: 72 U/L (ref 38–126)
Anion gap: 11 (ref 5–15)
BUN: <5 mg/dL — ABNORMAL LOW (ref 6–20)
CO2: 23 mmol/L (ref 22–32)
Calcium: 8.2 mg/dL — ABNORMAL LOW (ref 8.9–10.3)
Chloride: 97 mmol/L — ABNORMAL LOW (ref 98–111)
Creatinine, Ser: 0.63 mg/dL (ref 0.61–1.24)
GFR, Estimated: >60 mL/min (ref >60)
Glucose, Bld: 91 mg/dL (ref 70–99)
Potassium: 3.3 mmol/L — ABNORMAL LOW (ref 3.5–5.1)
Sodium: 131 mmol/L — ABNORMAL LOW (ref 135–145)
Total Bilirubin: 1.3 mg/dL — ABNORMAL HIGH (ref 0.3–1.2)
Total Protein: 5.9 g/dL — ABNORMAL LOW (ref 6.5–8.1)

## 2021-07-02 LAB — RAPID URINE DRUG SCREEN, HOSP PERFORMED
Amphetamines: NOT DETECTED
Barbiturates: NOT DETECTED
Benzodiazepines: NOT DETECTED
Cocaine: NOT DETECTED
Opiates: NOT DETECTED
Tetrahydrocannabinol: NOT DETECTED

## 2021-07-02 LAB — URINALYSIS, ROUTINE W REFLEX MICROSCOPIC
Glucose, UA: NEGATIVE mg/dL
Hgb urine dipstick: NEGATIVE
Ketones, ur: 80 mg/dL — AB
Leukocytes,Ua: NEGATIVE
Nitrite: POSITIVE — AB
Protein, ur: 30 mg/dL — AB
Specific Gravity, Urine: 1.025 (ref 1.005–1.030)
pH: 6.5 (ref 5.0–8.0)

## 2021-07-02 LAB — C-REACTIVE PROTEIN: CRP: 12 mg/dL — ABNORMAL HIGH (ref <1.0)

## 2021-07-02 LAB — RESP PANEL BY RT-PCR (FLU A&B, COVID) ARPGX2
Influenza A by PCR: NEGATIVE
Influenza B by PCR: NEGATIVE
SARS Coronavirus 2 by RT PCR: NEGATIVE

## 2021-07-02 LAB — LIPID PANEL
Cholesterol: 174 mg/dL (ref 0–200)
HDL: 92 mg/dL (ref >40)
LDL Cholesterol: 73 mg/dL (ref 0–99)
Total CHOL/HDL Ratio: 1.9 RATIO
Triglycerides: 43 mg/dL (ref <150)
VLDL: 9 mg/dL (ref 0–40)

## 2021-07-02 LAB — HIV ANTIBODY (ROUTINE TESTING W REFLEX): HIV Screen 4th Generation wRfx: NONREACTIVE

## 2021-07-02 LAB — LIPASE, BLOOD: Lipase: 24 U/L (ref 11–51)

## 2021-07-02 MED ORDER — ADULT MULTIVITAMIN W/MINERALS CH
1.0000 | ORAL_TABLET | Freq: Every day | ORAL | Status: DC
  Administered 2021-07-02 – 2021-07-05 (×4): 1 via ORAL
  Filled 2021-07-02 (×4): qty 1

## 2021-07-02 MED ORDER — MORPHINE SULFATE (PF) 2 MG/ML IV SOLN
2.0000 mg | INTRAVENOUS | Status: DC | PRN
  Administered 2021-07-02 (×3): 2 mg via INTRAVENOUS
  Filled 2021-07-02 (×3): qty 1

## 2021-07-02 MED ORDER — POTASSIUM CHLORIDE CRYS ER 20 MEQ PO TBCR: 40.0000 meq | EXTENDED_RELEASE_TABLET | Freq: Once | ORAL | Status: DC

## 2021-07-02 MED ORDER — ENOXAPARIN SODIUM 40 MG/0.4ML IJ SOSY
40.0000 mg | PREFILLED_SYRINGE | INTRAMUSCULAR | Status: DC
  Administered 2021-07-02 – 2021-07-05 (×3): 40 mg via SUBCUTANEOUS
  Filled 2021-07-02 (×4): qty 0.4

## 2021-07-02 MED ORDER — MORPHINE SULFATE (PF) 4 MG/ML IV SOLN
4.0000 mg | INTRAVENOUS | Status: DC | PRN
  Administered 2021-07-02 – 2021-07-04 (×7): 4 mg via INTRAVENOUS
  Filled 2021-07-02 (×8): qty 1

## 2021-07-02 MED ORDER — LORAZEPAM 2 MG/ML IJ SOLN: 1.0000 mg | INTRAMUSCULAR | Status: DC | PRN

## 2021-07-02 MED ORDER — ACETAMINOPHEN 325 MG PO TABS
650.0000 mg | ORAL_TABLET | Freq: Four times a day (QID) | ORAL | Status: DC | PRN
  Administered 2021-07-04 – 2021-07-05 (×4): 650 mg via ORAL
  Filled 2021-07-02 (×4): qty 2

## 2021-07-02 MED ORDER — THIAMINE HCL 100 MG/ML IJ SOLN: 100.0000 mg | Freq: Every day | INTRAMUSCULAR | Status: DC

## 2021-07-02 MED ORDER — FENTANYL CITRATE PF 50 MCG/ML IJ SOSY
50.0000 ug | PREFILLED_SYRINGE | Freq: Once | INTRAMUSCULAR | Status: AC
  Administered 2021-07-02: 50 ug via INTRAVENOUS
  Filled 2021-07-02: qty 1

## 2021-07-02 MED ORDER — LORAZEPAM 1 MG PO TABS: 1.0000 mg | ORAL_TABLET | ORAL | Status: DC | PRN

## 2021-07-02 MED ORDER — SODIUM CHLORIDE 0.9 % IV SOLN
1.0000 g | Freq: Once | INTRAVENOUS | Status: AC
  Administered 2021-07-02: 1 g via INTRAVENOUS
  Filled 2021-07-02: qty 10

## 2021-07-02 MED ORDER — POTASSIUM CHLORIDE 2 MEQ/ML IV SOLN
INTRAVENOUS | Status: DC
  Filled 2021-07-02 (×10): qty 1000

## 2021-07-02 MED ORDER — FOLIC ACID 1 MG PO TABS
1.0000 mg | ORAL_TABLET | Freq: Every day | ORAL | Status: DC
  Administered 2021-07-02 – 2021-07-05 (×4): 1 mg via ORAL
  Filled 2021-07-02 (×4): qty 1

## 2021-07-02 MED ORDER — NICOTINE 21 MG/24HR TD PT24
21.0000 mg | MEDICATED_PATCH | Freq: Every day | TRANSDERMAL | Status: DC
  Administered 2021-07-02 – 2021-07-05 (×4): 21 mg via TRANSDERMAL
  Filled 2021-07-02 (×4): qty 1

## 2021-07-02 MED ORDER — SODIUM CHLORIDE 0.9 % IV BOLUS
1000.0000 mL | Freq: Once | INTRAVENOUS | Status: AC
  Administered 2021-07-02: 1000 mL via INTRAVENOUS

## 2021-07-02 MED ORDER — THIAMINE HCL 100 MG PO TABS
100.0000 mg | ORAL_TABLET | Freq: Every day | ORAL | Status: DC
  Administered 2021-07-02 – 2021-07-05 (×4): 100 mg via ORAL
  Filled 2021-07-02 (×4): qty 1

## 2021-07-02 MED ORDER — IOHEXOL 350 MG/ML SOLN
80.0000 mL | Freq: Once | INTRAVENOUS | Status: AC | PRN
  Administered 2021-07-02: 80 mL via INTRAVENOUS

## 2021-07-02 MED ORDER — MAGNESIUM SULFATE 2 GM/50ML IV SOLN
2.0000 g | Freq: Once | INTRAVENOUS | Status: AC
  Administered 2021-07-02: 2 g via INTRAVENOUS
  Filled 2021-07-02: qty 50

## 2021-07-02 MED ORDER — POLYETHYLENE GLYCOL 3350 17 G PO PACK: 17.0000 g | PACK | Freq: Every day | ORAL | Status: DC | PRN

## 2021-07-02 MED ORDER — ONDANSETRON HCL 4 MG/2ML IJ SOLN
4.0000 mg | Freq: Four times a day (QID) | INTRAMUSCULAR | Status: DC | PRN
  Administered 2021-07-02: 4 mg via INTRAVENOUS
  Filled 2021-07-02 (×2): qty 2

## 2021-07-02 MED ORDER — ONDANSETRON HCL 4 MG PO TABS: 4.0000 mg | ORAL_TABLET | Freq: Four times a day (QID) | ORAL | Status: DC | PRN

## 2021-07-02 MED ORDER — SODIUM CHLORIDE 0.9 % IV SOLN: INTRAVENOUS | Status: DC

## 2021-07-02 MED ORDER — ACETAMINOPHEN 650 MG RE SUPP: 650.0000 mg | Freq: Four times a day (QID) | RECTAL | Status: DC | PRN

## 2021-07-02 NOTE — Progress Notes (Signed)
PROGRESS NOTE  Brief Narrative: Edward Wall is a 35 y.o. side effects with a history of alcohol abuse, tobacco use who presented to the ED 9/11 with worsening episodic pain in the lower-mid back and upper/left-sided abdomen that began a few weeks prior associated with vomiting and returned worsening over the previous couple days. WBC 13.8k, lipase 24, TBili 1.8. AST, ALT, and AlkPhos wnl. CT abd/pelvis revealed inflammation about the pancreatic tail with 3.9cm pseudocyst and associated reactive inflammation of proximal descending colon.   Subjective: Confirms that he drinks a gallon of [employer] over the course of 5-7 days, has not had the desire to stop necessarily. These episodes are as described above, currently while still pain is in left abdomen/flank/back 5/10, up to 9/10 with movements, hiccups, cough. Did not report urinary symptoms.   Objective: BP 130/84   Pulse 72   Temp 99.1 F (37.3 C)   Resp 18   SpO2 94%   Gen: Nontoxic thin male in discomfort but not distress Pulm: Clear and nonlabored on room air  CV: RRR, no murmur, no JVD, no edema GI: Soft, but guarding and tenderness noted in epigastrium and LUQ without rebound. +BS. No palpable deformity of spine/back.  Neuro: Alert and oriented. No focal deficits. Skin: No rashes, lesions or ulcers on visualized skin  Assessment & Plan: Principal Problem:   Acute pancreatitis Active Problems:   Pseudocyst of pancreas   Nicotine dependence, cigarettes, uncomplicated   Alcohol abuse   Hypokalemia  Acute-subacute alcoholic pancreatitis with pseudocyst: Time course, normalization of lipase, and presence of pseudocyst at presentation suggest subacute development of pancreatitis. Atypical clinical syndrome/pain distribution is likely related to more retroperitoneal location and associated colonic inflammation. Normal triglycerides, no gallstones on U/S to suggest alternative etiologies.  - NPO, IVF, IV analgesics, IV  antiemetics. Once pain is improved, can transition to clear liquids with concomitant switch to po analgesia.  - Leukocytosis noted, though favored to be reactive at this time without fever. Will monitor off abx for now. CRP will be checked and followed. - Will need repeat imaging (e.g. CT or MR w/ and w/o in 4-6 weeks to document improvement/resolution, vs. sooner if clinically fails to fly here. Size of fluid collection suggests drainage may not be necessary.   Alcohol abuse: EtOH level negative on arrival. Volume of intake suggests risk of withdrawal high.  - Continue CIWA for now.  - Cessation counseling provided and will be ongoing.  - Empiric thiamine, MVM  Hepatic steatosis: D/w pt and mother. This is due to alcohol abuse.   Macrocytosis: This is due to alcohol abuse/hepatic damage.  - Check B12, folate to r/o superimposed deficiency.   Pyuria:  - Monitor urine culture to guide Tx. No reported symptoms at this time.  Tobacco use:  - Nicotine patch, cessation counseling  Hypokalemia, hypomagnesemia:  - Supplement in IVF, by IV.  Tyrone Nine, MD Pager on amion 07/02/2021, 10:17 AM

## 2021-07-02 NOTE — H&P (Signed)
History and Physical    Edward Wall ATF:[id number] DOB: 06/02/1986 DOA: 07/01/2021  PCP: Patient, No Pcp Per (Inactive)  Patient coming from: Home   Chief Complaint:  Chief Complaint  Patient presents with   Abdominal Pain   Nausea     HPI:    35 year old male with past medical history of nicotine dependence, alcohol abuse presenting to [facility] long hospital emergency room with complaints of abdominal pain.  Patient explains that approximately 2 or 3 weeks ago patient began to develop episodic abdominal pain, occurring in the left side of the abdomen and radiating to the left flank and back.  Episodes have been lasting as long as an entire day, have resulted in severe sharp pains, worse with cough and deep breathing and associated with nausea and occasional nonbilious nonbloody vomiting.  In addition to patient's waxing and waning symptoms, patient is also been experiencing some intermittent diarrhea with abdominal cramping.  Patient denies any generalized weakness or loss in appetite.  Patient denies any fevers.  Patient does complain of intermittent night sweats.  Patient denies any unintentional weight loss.  Patient states that he has noticed some bright red blood on his toilet paper on occasion when wiping.  Due to patient's progressively worsening episodes of severe abdominal flank and back pain patient eventually presented to [facility] emergency department for evaluation.  Upon evaluation in the emergency department patient was found to have evidence of pancreatitis on CT imaging with evidence of a 3.9 cm pseudocyst.  In addition to this there were inflammatory changes and wall thickening of the proximal descending colon noted likely secondary to pancreatic inflammation.  The hospitalist group was then called to assess the patient for admission to the hospital.  Review of Systems:   Review of Systems  Constitutional:  Positive for malaise/fatigue.  Gastrointestinal:   Positive for abdominal pain, diarrhea, nausea and vomiting.  Neurological:  Positive for weakness.  All other systems reviewed and are negative.  Past Medical History:  Diagnosis Date   Radial nerve palsy, right     Past Surgical History:  Procedure Laterality Date   ORIF WRIST FRACTURE Right 07/16/2019   Procedure: OPEN REDUCTION INTERNAL FIXATION (ORIF) WRIST FRACTURE;  Surgeon: Roby Lofts, MD;  Location: [facility] OR;  Service: Orthopedics;  Laterality: Right;     reports that he has been smoking cigarettes. He has been smoking an average of .5 packs per day. He has never used smokeless tobacco. He reports current alcohol use of about 85.0 standard drinks per week. He reports current drug use. Frequency: 7.00 times per week. Drug: Marijuana.  No Known Allergies  Family History  Problem Relation Age of Onset   Crohn's disease Mother    Leukemia Father      Prior to Admission medications   Medication Sig Start Date End Date Taking? Authorizing Provider  Ibuprofen 200 MG CAPS Take 400 mg by mouth every 8 (eight) hours as needed for moderate pain.   Yes [provider]  cephALEXin (KEFLEX) 500 MG capsule Take 1 capsule (500 mg total) by mouth 2 (two) times daily. Patient not taking: No sig reported 07/05/17   Isa Rankin, MD  oxyCODONE (OXY IR/ROXICODONE) 5 MG immediate release tablet Take 1 tablet (5 mg total) by mouth every 6 (six) hours as needed for moderate pain (pain not controlled with tylenol). Patient not taking: No sig reported 07/18/19   Abigail Miyamoto, MD    Physical Exam: Vitals:   07/02/21 0150 07/02/21  0200 07/02/21 0315 07/02/21 0529  BP: (!) 129/93 (!) 126/94 (!) 136/102 (!) 128/91  Pulse: 73 92 88 73  Resp: 14  18 16   Temp:      SpO2: 99%  95% 97%    Constitutional: Awake alert and oriented x3, patient is in distress due to abdominal pain  Skin: no rashes, no lesions, good skin turgor noted. Eyes: Pupils are equally reactive to light.   No evidence of scleral icterus or conjunctival pallor.  ENMT: Moist mucous membranes noted.  Posterior pharynx clear of any exudate or lesions.   Neck: normal, supple, no masses, no thyromegaly.  No evidence of jugular venous distension.   Respiratory: clear to auscultation bilaterally, no wheezing, no crackles. Normal respiratory effort. No accessory muscle use.  Cardiovascular: Regular rate and rhythm, no murmurs / rubs / gallops. No extremity edema. 2+ pedal pulses. No carotid bruits.  Chest:   Nontender without crepitus or deformity.   Back:   Notable substantial left flank pain without evidence of crepitus or deformity. Abdomen: Significant left upper quadrant, epigastric tenderness.  Abdomen is soft.  Positive bowel sounds all quadrants.  No organomegaly appreciated. Musculoskeletal: No joint deformity upper and lower extremities. Good ROM, no contractures. Normal muscle tone.  Neurologic: CN 2-12 grossly intact. Sensation intact.  Patient moving all 4 extremities spontaneously.  Patient is following all commands.  Patient is responsive to verbal stimuli.   Psychiatric: Patient exhibits normal mood with appropriate affect.  Patient seems to possess insight as to their current situation.     Labs on Admission: I have personally reviewed following labs and imaging studies -   CBC: Recent Labs  Lab 07/01/21 1852  WBC 13.8*  NEUTROABS 11.1*  HGB 15.2  HCT 42.7  MCV 101.2*  PLT 192   Basic Metabolic Panel: Recent Labs  Lab 07/01/21 1852  NA 133*  K 3.4*  CL 94*  CO2 25  GLUCOSE 127*  BUN 5*  CREATININE 0.66  CALCIUM 9.5   GFR: CrCl cannot be calculated (Unknown ideal weight.). Liver Function Tests: Recent Labs  Lab 07/01/21 1852  AST 30  ALT 27  ALKPHOS 94  BILITOT 1.8*  PROT 7.1  ALBUMIN 3.8   Recent Labs  Lab 07/01/21 1852  LIPASE 24   No results for input(s): AMMONIA in the last 168 hours. Coagulation Profile: No results for input(s): INR, PROTIME in the  last 168 hours. Cardiac Enzymes: No results for input(s): CKTOTAL, CKMB, CKMBINDEX, TROPONINI in the last 168 hours. BNP (last 3 results) No results for input(s): PROBNP in the last 8760 hours. HbA1C: No results for input(s): HGBA1C in the last 72 hours. CBG: No results for input(s): GLUCAP in the last 168 hours. Lipid Profile: No results for input(s): CHOL, HDL, LDLCALC, TRIG, CHOLHDL, LDLDIRECT in the last 72 hours. Thyroid Function Tests: No results for input(s): TSH, T4TOTAL, FREET4, T3FREE, THYROIDAB in the last 72 hours. Anemia Panel: No results for input(s): VITAMINB12, FOLATE, FERRITIN, TIBC, IRON, RETICCTPCT in the last 72 hours. Urine analysis:    Component Value Date/Time   COLORURINE YELLOW (A) 07/01/2021 2315   APPEARANCEUR HAZY (A) 07/01/2021 2315   LABSPEC 1.025 07/01/2021 2315   PHURINE 6.5 07/01/2021 2315   GLUCOSEU NEGATIVE 07/01/2021 2315   HGBUR NEGATIVE 07/01/2021 2315   BILIRUBINUR SMALL (A) 07/01/2021 2315   KETONESUR 80 (A) 07/01/2021 2315   PROTEINUR 30 (A) 07/01/2021 2315   NITRITE POSITIVE (A) 07/01/2021 2315   LEUKOCYTESUR NEGATIVE 07/01/2021 2315  Radiological Exams on Admission - Personally Reviewed: CT ABDOMEN PELVIS W CONTRAST  Result Date: 07/02/2021 CLINICAL DATA:  Abdominal pain, nausea/vomiting/diarrhea EXAM: CT ABDOMEN AND PELVIS WITH CONTRAST TECHNIQUE: Multidetector CT imaging of the abdomen and pelvis was performed using the standard protocol following bolus administration of intravenous contrast. CONTRAST:  5mL OMNIPAQUE IOHEXOL 350 MG/ML SOLN COMPARISON:  07/16/2019 FINDINGS: Motion degraded images. Lower chest: Mild left basilar atelectasis. Hepatobiliary: Liver is within normal limits. Gallbladder is unremarkable. No intrahepatic or extrahepatic ductal dilatation. Pancreas: 3.9 x 3.0 cm thick-walled fluid collection along the pancreatic tail with peripancreatic inflammatory changes/fluid (series 2/image 22), favoring acute or subacute  pancreatitis with a pancreatic pseudocyst. Spleen: Within normal limits. Adrenals/Urinary Tract: Adrenal glands are within normal limits. Kidneys are notable for excretory contrast in the bilateral renal collecting systems. No hydronephrosis. Layering contrast in the bladder, otherwise within normal limits. Stomach/Bowel: Stomach is within normal limits. No evidence of bowel obstruction. Normal appendix (coronal image 78). Wall thickening with pericolonic inflammatory changes involving the proximal descending colon in the left upper abdomen (series 2/image 27), adjacent to the pancreatic tail, favored to be secondary to the patient's recent acute pancreatitis. Vascular/Lymphatic: No evidence of abdominal aortic aneurysm. No suspicious abdominopelvic lymphadenopathy. Reproductive: Prostate is unremarkable. Other: No abdominopelvic ascites. No free air. Musculoskeletal: Visualized osseous structures are within normal limits. IMPRESSION: Normal appendix. Suspected acute/subacute pancreatitis with a 3.9 cm pseudocyst in the pancreatic tail. Adjacent pericolonic inflammatory changes/wall thickening involving the proximal descending colon, favored to be secondary to the pancreatic inflammation. Consider follow-up CT or MR abdomen with/without contrast in 4-6 weeks to document improvement/resolution. Electronically Signed   By: Charline Bills M.D.   On: 07/02/2021 01:39    EKG: Personally reviewed.  Rhythm is normal sinus rhythm with heart rate of 71 bpm.  No dynamic ST segment changes appreciated.  Assessment/Plan Principal Problem:   Acute pancreatitis with pseudocyst of the tail the pancreas.  Patient presenting with several week history of waxing and waning left-sided abdominal pain left flank pain and back pain. CT imaging is concerning for pancreatitis complicated by pseudocyst formation Remarkably, lipase is normal N.p.o. except for ice chips and small sips of clear liquids Hydrating patient with  intravenous isotonic fluids As needed opiate-based analgesics for associated pain Counseling patient on alcohol cessation which is likely the culprit Obtaining right upper quadrant ultrasound, lipid panel, toxicology screen If patient fails to rapidly clinically improve in the next 24 to 48 hours we will consider gastroenterology consultation especially considering odd presentation with normal lipase.  Active Problems:    Nicotine dependence, cigarettes, uncomplicated  Counseling on cessation Providing patient with nicotine replacement therapy per his request.    Alcohol abuse  Patient admits to drinking approximately 1 gallon of whiskey weekly Patient is a high risk of alcohol withdrawal Initiating CIWA protocol with as needed benzodiazepines for evidence of withdrawal    Hypokalemia  Replacing with oral potassium chloride Monitoring potassium levels with serial chemistries   Code Status:  Full code  code status decision has been confirmed with: patient Family Communication: deferred   Status is: Observation  The patient remains OBS appropriate and will d/c before 2 midnights.  Dispo: The patient is from: Home              Anticipated d/c is to: Home              Patient currently is not medically stable to d/c.   Difficult to place patient No  Marinda Elk MD [facility] Pager [phone]  If 7PM-7AM, please contact night-coverage [email or web address] Use universal [place] password for that web site. If you do not have the password, please call the hospital operator.  07/02/2021, 5:50 AM

## 2021-07-02 NOTE — ED Notes (Signed)
Pt refused oral K+ due to nausea. Administered zofran and will assess at a later time

## 2021-07-03 LAB — COMPREHENSIVE METABOLIC PANEL
ALT: 16 U/L (ref 0–44)
AST: 18 U/L (ref 15–41)
Albumin: 2.8 g/dL — ABNORMAL LOW (ref 3.5–5.0)
Alkaline Phosphatase: 74 U/L (ref 38–126)
Anion gap: 14 (ref 5–15)
BUN: <5 mg/dL — ABNORMAL LOW (ref 6–20)
CO2: 22 mmol/L (ref 22–32)
Calcium: 8.6 mg/dL — ABNORMAL LOW (ref 8.9–10.3)
Chloride: 97 mmol/L — ABNORMAL LOW (ref 98–111)
Creatinine, Ser: 0.64 mg/dL (ref 0.61–1.24)
GFR, Estimated: >60 mL/min (ref >60)
Glucose, Bld: 74 mg/dL (ref 70–99)
Potassium: 4 mmol/L (ref 3.5–5.1)
Sodium: 133 mmol/L — ABNORMAL LOW (ref 135–145)
Total Bilirubin: 1.6 mg/dL — ABNORMAL HIGH (ref 0.3–1.2)
Total Protein: 5.8 g/dL — ABNORMAL LOW (ref 6.5–8.1)

## 2021-07-03 LAB — CBC WITH DIFFERENTIAL/PLATELET
Abs Immature Granulocytes: 0.09 10*3/uL — ABNORMAL HIGH (ref 0.00–0.07)
Basophils Absolute: 0 10*3/uL (ref 0.0–0.1)
Basophils Relative: 0 %
Eosinophils Absolute: 0 10*3/uL (ref 0.0–0.5)
Eosinophils Relative: 0 %
HCT: 38.9 % — ABNORMAL LOW (ref 39.0–52.0)
Hemoglobin: 13.7 g/dL (ref 13.0–17.0)
Immature Granulocytes: 1 %
Lymphocytes Relative: 10 %
Lymphs Abs: 1.5 10*3/uL (ref 0.7–4.0)
MCH: 35.9 pg — ABNORMAL HIGH (ref 26.0–34.0)
MCHC: 35.2 g/dL (ref 30.0–36.0)
MCV: 101.8 fL — ABNORMAL HIGH (ref 80.0–100.0)
Monocytes Absolute: 2 10*3/uL — ABNORMAL HIGH (ref 0.1–1.0)
Monocytes Relative: 14 %
Neutro Abs: 10.8 10*3/uL — ABNORMAL HIGH (ref 1.7–7.7)
Neutrophils Relative %: 75 %
Platelets: 185 10*3/uL (ref 150–400)
RBC: 3.82 MIL/uL — ABNORMAL LOW (ref 4.22–5.81)
RDW: 13.2 % (ref 11.5–15.5)
WBC: 14.4 10*3/uL — ABNORMAL HIGH (ref 4.0–10.5)
nRBC: 0 % (ref 0.0–0.2)

## 2021-07-03 LAB — PHOSPHORUS: Phosphorus: 2.3 mg/dL — ABNORMAL LOW (ref 2.5–4.6)

## 2021-07-03 LAB — MAGNESIUM: Magnesium: 1.8 mg/dL (ref 1.7–2.4)

## 2021-07-03 LAB — C-REACTIVE PROTEIN: CRP: 19.5 mg/dL — ABNORMAL HIGH (ref <1.0)

## 2021-07-03 LAB — URINE CULTURE: Culture: NO GROWTH

## 2021-07-03 LAB — GLUCOSE, CAPILLARY: Glucose-Capillary: 78 mg/dL (ref 70–99)

## 2021-07-03 LAB — FOLATE: Folate: 9.7 ng/mL (ref >5.9)

## 2021-07-03 LAB — VITAMIN B12: Vitamin B-12: 217 pg/mL (ref 180–914)

## 2021-07-03 MED ORDER — CYANOCOBALAMIN 1000 MCG/ML IJ SOLN
1000.0000 ug | Freq: Every day | INTRAMUSCULAR | Status: DC
  Administered 2021-07-03 – 2021-07-04 (×2): 1000 ug via INTRAMUSCULAR
  Filled 2021-07-03 (×3): qty 1

## 2021-07-03 MED ORDER — SODIUM PHOSPHATES 45 MMOLE/15ML IV SOLN
15.0000 mmol | Freq: Once | INTRAVENOUS | Status: AC
  Administered 2021-07-03: 15 mmol via INTRAVENOUS
  Filled 2021-07-03: qty 5

## 2021-07-03 NOTE — Progress Notes (Signed)
PROGRESS NOTE  Edward Wall  HLK:[id number] DOB: 1986-07-19 DOA: 07/01/2021 PCP: Edward Wall, No Pcp Per (Inactive)   Brief Narrative: He was admitted for first episode of acute alcoholic pancreatitis with pseudocyst. Started on IVF, NPO, IV analgesics and antiemetics.  Assessment & Plan: Principal Problem:   Acute pancreatitis Active Problems:   Pseudocyst of pancreas   Nicotine dependence, cigarettes, uncomplicated   Alcohol abuse   Hypokalemia  Acute-subacute alcoholic pancreatitis with pseudocyst: Time course, normalization of lipase, and presence of pseudocyst at presentation suggest subacute development of pancreatitis. Atypical clinical syndrome/pain distribution is likely related to more retroperitoneal location and associated colonic inflammation. Normal triglycerides, no gallstones on U/S to suggest alternative etiologies.  - NPO, IVF, IV analgesics, IV antiemetics. CRP up today, exam reassuring. Will stay with noncaloric liquids today, recheck in AM with plan to trial clears once stable. Would concomitantly switch to po analgesics. - Leukocytosis noted, though favored to be reactive at this time without fever. Continue to monitor off abx for now.   - Will need repeat imaging (e.g. CT or MR w/ and w/o) in 4-6 weeks to document improvement/resolution, vs. sooner if clinically fails to fly here. Size of fluid collection suggests drainage may not be necessary.    Alcohol abuse: EtOH level negative on arrival.   - Can DC CIWA protocol with no evidence of withdrawal to date - Cessation counseling provided and will be ongoing.  - Empiric thiamine, MVM   Hepatic steatosis: D/w pt and mother. This is due to alcohol abuse.    Macrocytosis: This is due to alcohol abuse/hepatic damage.  - With low-normal B12, will initiate supplementation by IM while admitted, then po.   Pyuria:  - Monitor urine culture to guide Tx. No reported symptoms at this time.   Tobacco use:  - Nicotine patch,  cessation counseling   Hypokalemia, hypomagnesemia:  - Continue K in IVF.  Hypophosphatemia:  - Supplement  DVT prophylaxis: Lovenox Code Status: Full Family Communication: Mother at bedside Disposition Plan:  Status is: Inpatient  Remains inpatient appropriate because:Ongoing active pain requiring inpatient pain management and IV treatments appropriate due to intensity of illness or inability to take PO  Dispo: The Edward Wall is from: Home              Anticipated d/c is to: Home              Edward Wall currently is not medically stable to d/c.   Consultants:  None  Procedures:  None  Antimicrobials: None   Subjective: Pain is severe, waxing/waning mostly in left flank/abdomen radiating around to the back. Minimal nausea, no vomiting, wants to start taking jello. Getting IV morphine every 3-6 hours. Overall pain feels a bit better, moderate when laying still and having had morphine.   Objective: Vitals:   07/02/21 1900 07/02/21 2317 07/03/21 0348 07/03/21 0701  BP: (!) 151/97 (!) 132/98 131/89 134/85  Pulse: 83 88 92 81  Resp: 18 18 16 18   Temp: 98.7 F (37.1 C) 98.2 F (36.8 C) 99.3 F (37.4 C) 97.6 F (36.4 C)  TempSrc: Oral Oral Oral Oral  SpO2: 98% 98% 97% 94%   Gen: 35 y.o. male in no distress Pulm: Non-labored breathing room air. Clear to auscultation bilaterally.  CV: Regular rate and rhythm. No murmur, rub, or gallop. No JVD, no pedal edema. GI: Abdomen soft, tender on left upper abdomen and left flank without rebound or guarding. Hypoactive BS.  Ext: Warm, no deformities Skin: No rashes, lesions  or ulcers Neuro: Alert and oriented. No focal neurological deficits. Psych: Judgement and insight appear normal. Mood & affect appropriate.   CBC: Recent Labs  Lab 07/01/21 1852 07/02/21 0633 07/03/21 0445  WBC 13.8* 14.3* 14.4*  NEUTROABS 11.1* 10.8* 10.8*  HGB 15.2 14.2 13.7  HCT 42.7 41.5 38.9*  MCV 101.2* 104.0* 101.8*  PLT 192 172 185   Basic  Metabolic Panel: Recent Labs  Lab 07/01/21 1852 07/02/21 0633 07/03/21 0445  NA 133* 131* 133*  K 3.4* 3.3* 4.0  CL 94* 97* 97*  CO2 25 23 22   GLUCOSE 127* 91 74  BUN 5* <5* <5*  CREATININE 0.66 0.63 0.64  CALCIUM 9.5 8.2* 8.6*  MG  --  1.5* 1.8  PHOS  --   --  2.3*   Liver Function Tests: Recent Labs  Lab 07/01/21 1852 07/02/21 0633 07/03/21 0445  AST 30 18 18   ALT 27 19 16   ALKPHOS 94 72 74  BILITOT 1.8* 1.3* 1.6*  PROT 7.1 5.9* 5.8*  ALBUMIN 3.8 3.0* 2.8*   Lipase: 24 > 24 Triglycerides: 43 Vitamin B12: 217 Folate: 9.7  Radiology Studies: CT ABDOMEN PELVIS W CONTRAST 07/02/2021  Suspected acute/subacute pancreatitis with a 3.9 cm pseudocyst in the pancreatic tail. Adjacent pericolonic inflammatory changes/wall thickening involving the proximal descending colon, favored to be secondary to the pancreatic inflammation. Consider follow-up CT or MR abdomen with/without contrast in 4-6 weeks to document improvement/resolution.   RUQ U/S 07/02/2021: No evidence for cholelithiasis. Diffusely increased echogenicity of liver parenchyma suggests fatty deposition. Electronically Signed   By: M.D.   On: 07/02/2021 06:42     LOS: 1 day   Time spent: 25 minutes.  09/01/2021, MD [facility] [email or web address] 07/03/2021, 1:14 PM

## 2021-07-04 LAB — C-REACTIVE PROTEIN: CRP: 22.8 mg/dL — ABNORMAL HIGH (ref <1.0)

## 2021-07-04 LAB — CBC WITH DIFFERENTIAL/PLATELET
Abs Immature Granulocytes: 0.11 10*3/uL — ABNORMAL HIGH (ref 0.00–0.07)
Basophils Absolute: 0 10*3/uL (ref 0.0–0.1)
Basophils Relative: 0 %
Eosinophils Absolute: 0 10*3/uL (ref 0.0–0.5)
Eosinophils Relative: 0 %
HCT: 39.5 % (ref 39.0–52.0)
Hemoglobin: 13.9 g/dL (ref 13.0–17.0)
Immature Granulocytes: 1 %
Lymphocytes Relative: 12 %
Lymphs Abs: 1.3 10*3/uL (ref 0.7–4.0)
MCH: 35.7 pg — ABNORMAL HIGH (ref 26.0–34.0)
MCHC: 35.2 g/dL (ref 30.0–36.0)
MCV: 101.5 fL — ABNORMAL HIGH (ref 80.0–100.0)
Monocytes Absolute: 2.1 10*3/uL — ABNORMAL HIGH (ref 0.1–1.0)
Monocytes Relative: 18 %
Neutro Abs: 8 10*3/uL — ABNORMAL HIGH (ref 1.7–7.7)
Neutrophils Relative %: 69 %
Platelets: 214 10*3/uL (ref 150–400)
RBC: 3.89 MIL/uL — ABNORMAL LOW (ref 4.22–5.81)
RDW: 12.8 % (ref 11.5–15.5)
WBC: 11.7 10*3/uL — ABNORMAL HIGH (ref 4.0–10.5)
nRBC: 0 % (ref 0.0–0.2)

## 2021-07-04 LAB — COMPREHENSIVE METABOLIC PANEL
ALT: 17 U/L (ref 0–44)
AST: 22 U/L (ref 15–41)
Albumin: 3 g/dL — ABNORMAL LOW (ref 3.5–5.0)
Alkaline Phosphatase: 78 U/L (ref 38–126)
Anion gap: 13 (ref 5–15)
BUN: <5 mg/dL — ABNORMAL LOW (ref 6–20)
CO2: 24 mmol/L (ref 22–32)
Calcium: 9.1 mg/dL (ref 8.9–10.3)
Chloride: 100 mmol/L (ref 98–111)
Creatinine, Ser: 0.51 mg/dL — ABNORMAL LOW (ref 0.61–1.24)
GFR, Estimated: >60 mL/min (ref >60)
Glucose, Bld: 76 mg/dL (ref 70–99)
Potassium: 4.2 mmol/L (ref 3.5–5.1)
Sodium: 137 mmol/L (ref 135–145)
Total Bilirubin: 1.6 mg/dL — ABNORMAL HIGH (ref 0.3–1.2)
Total Protein: 6.1 g/dL — ABNORMAL LOW (ref 6.5–8.1)

## 2021-07-04 LAB — MAGNESIUM: Magnesium: 1.8 mg/dL (ref 1.7–2.4)

## 2021-07-04 LAB — PHOSPHORUS: Phosphorus: 2.5 mg/dL (ref 2.5–4.6)

## 2021-07-04 MED ORDER — OXYCODONE HCL 5 MG PO TABS
5.0000 mg | ORAL_TABLET | ORAL | Status: DC | PRN
  Administered 2021-07-04 – 2021-07-05 (×2): 5 mg via ORAL
  Filled 2021-07-04 (×2): qty 1

## 2021-07-04 MED ORDER — LACTATED RINGERS IV SOLN: INTRAVENOUS | Status: DC

## 2021-07-04 NOTE — Progress Notes (Signed)
PROGRESS NOTE  Edward Wall  MCN:[id number] DOB: 28-Sep-1986 DOA: 07/01/2021 PCP: Patient, No Pcp Per (Inactive)   Brief Narrative: He was admitted for first episode of acute alcoholic pancreatitis with pseudocyst. Started on IVF, NPO, IV analgesics and antiemetics.  Assessment & Plan: Principal Problem:   Acute pancreatitis Active Problems:   Pseudocyst of pancreas   Nicotine dependence, cigarettes, uncomplicated   Alcohol abuse   Hypokalemia  Acute-subacute alcoholic pancreatitis with pseudocyst: Time course, normalization of lipase, and presence of pseudocyst at presentation suggest subacute development of pancreatitis. Atypical clinical syndrome/pain distribution is likely related to more retroperitoneal location and associated colonic inflammation. Normal triglycerides, no gallstones on U/S to suggest alternative etiologies.  - Continue LR @125cc /hr.  - WBC down, remains afebrile, exam reassuring at this time and pt without nausea or vomiting. We will trial clear liquids with concomitant conversion to po analgesic (oxycodone).   - Continue to monitor off abx for now.   - Will need repeat imaging (e.g. CT or MR w/ and w/o) in 4-6 weeks to document improvement/resolution, vs. sooner if clinically fails to fly here. Size of fluid collection suggests drainage may not be necessary.    Alcohol abuse: EtOH level negative on arrival. No evidence of withdrawal. - Cessation counseling provided and will be ongoing.  - Empiric thiamine, MVM   Hepatic steatosis: D/w pt and mother. This is due to alcohol abuse.    Macrocytosis: This is due to alcohol abuse/hepatic damage.  - With low-normal B12, initiated IM supplementation while admitted.   Pyuria: Negative urine culture, no urinary symptoms.    Tobacco use:  - Nicotine patch, cessation counseling   Hypokalemia, hypomagnesemia: Improved.   Hypophosphatemia:  - Supplemented, resolved.  DVT prophylaxis: Lovenox Code Status:  Full Family Communication: None at bedside Disposition Plan:  Status is: Inpatient  Remains inpatient appropriate because:Ongoing active pain requiring inpatient pain management and IV treatments appropriate due to intensity of illness or inability to take PO  Dispo: The patient is from: Home              Anticipated d/c is to: Home              Patient currently is not medically stable to d/c.   Consultants:  None  Procedures:  None  Antimicrobials: None   Subjective: Abdominal pain radiating to left side/flank/and back is stable-to-improved, mild when getting analgesic. Not associated with nausea or vomiting. He's very hungry. No fevers, chills, productive cough, dyspnea, urinary symptoms.  Objective: Vitals:   07/03/21 0701 07/03/21 1844 07/03/21 2054 07/04/21 0452  BP: 134/85 132/90 (!) 133/98 119/82  Pulse: 81 85 91 89  Resp: 18 17 16 18   Temp: 97.6 F (36.4 C) 99.5 F (37.5 C) 98.9 F (37.2 C) 98.9 F (37.2 C)  TempSrc: Oral Oral Oral Oral  SpO2: 94% 97% 99% 99%   Gen: 35 y.o. male in no distress Pulm: Nonlabored breathing room air. Clear. CV: Regular rate and rhythm. No murmur, rub, or gallop. No JVD, no dependent edema. GI: Abdomen soft, scaphoid, mildly tender on left abdomen, only to very deep palpation on epigastrium without rebound or guarding, +BS. Ext: Warm, no deformities Skin: No rashes, lesions or ulcers on visualized skin. Neuro: Alert and oriented. No focal neurological deficits. Psych: Judgement and insight appear fair. Mood euthymic & affect congruent. Behavior is appropriate.  CBC: Recent Labs  Lab 07/01/21 1852 07/02/21 0633 07/03/21 0445 07/04/21 0506  WBC 13.8* 14.3* 14.4* 11.7*  NEUTROABS 11.1*  10.8* 10.8* 8.0*  HGB 15.2 14.2 13.7 13.9  HCT 42.7 41.5 38.9* 39.5  MCV 101.2* 104.0* 101.8* 101.5*  PLT 192 172 185 214   Basic Metabolic Panel: Recent Labs  Lab 07/01/21 1852 07/02/21 0633 07/03/21 0445 07/04/21 0506  NA 133* 131*  133* 137  K 3.4* 3.3* 4.0 4.2  CL 94* 97* 97* 100  CO2 25 23 22 24   GLUCOSE 127* 91 74 76  BUN 5* <5* <5* <5*  CREATININE 0.66 0.63 0.64 0.51*  CALCIUM 9.5 8.2* 8.6* 9.1  MG  --  1.5* 1.8 1.8  PHOS  --   --  2.3* 2.5   Liver Function Tests: Recent Labs  Lab 07/01/21 1852 07/02/21 0633 07/03/21 0445 07/04/21 0506  AST 30 18 18 22   ALT 27 19 16 17   ALKPHOS 94 72 74 78  BILITOT 1.8* 1.3* 1.6* 1.6*  PROT 7.1 5.9* 5.8* 6.1*  ALBUMIN 3.8 3.0* 2.8* 3.0*   Lipase: 24 > 24 Triglycerides: 43 Vitamin B12: 217 Folate: 9.7  Radiology Studies: CT ABDOMEN PELVIS W CONTRAST 07/02/2021  Suspected acute/subacute pancreatitis with a 3.9 cm pseudocyst in the pancreatic tail. Adjacent pericolonic inflammatory changes/wall thickening involving the proximal descending colon, favored to be secondary to the pancreatic inflammation. Consider follow-up CT or MR abdomen with/without contrast in 4-6 weeks to document improvement/resolution.   RUQ U/S 07/02/2021: No evidence for cholelithiasis. Diffusely increased echogenicity of liver parenchyma suggests fatty deposition. Electronically Signed   By: M.D.   On: 07/02/2021 06:42     LOS: 2 days   Time spent: 25 minutes.  09/01/2021, MD [facility] [email or web address] 07/04/2021, 8:37 AM

## 2021-07-05 LAB — CBC WITH DIFFERENTIAL/PLATELET
Abs Immature Granulocytes: 0.06 10*3/uL (ref 0.00–0.07)
Basophils Absolute: 0 10*3/uL (ref 0.0–0.1)
Basophils Relative: 0 %
Eosinophils Absolute: 0 10*3/uL (ref 0.0–0.5)
Eosinophils Relative: 0 %
HCT: 40.4 % (ref 39.0–52.0)
Hemoglobin: 14.3 g/dL (ref 13.0–17.0)
Immature Granulocytes: 1 %
Lymphocytes Relative: 14 %
Lymphs Abs: 1.3 10*3/uL (ref 0.7–4.0)
MCH: 35.8 pg — ABNORMAL HIGH (ref 26.0–34.0)
MCHC: 35.4 g/dL (ref 30.0–36.0)
MCV: 101.3 fL — ABNORMAL HIGH (ref 80.0–100.0)
Monocytes Absolute: 2 10*3/uL — ABNORMAL HIGH (ref 0.1–1.0)
Monocytes Relative: 22 %
Neutro Abs: 5.8 10*3/uL (ref 1.7–7.7)
Neutrophils Relative %: 63 %
Platelets: 271 10*3/uL (ref 150–400)
RBC: 3.99 MIL/uL — ABNORMAL LOW (ref 4.22–5.81)
RDW: 12.5 % (ref 11.5–15.5)
WBC: 9.2 10*3/uL (ref 4.0–10.5)
nRBC: 0 % (ref 0.0–0.2)

## 2021-07-05 LAB — COMPREHENSIVE METABOLIC PANEL
ALT: 33 U/L (ref 0–44)
AST: 58 U/L — ABNORMAL HIGH (ref 15–41)
Albumin: 2.9 g/dL — ABNORMAL LOW (ref 3.5–5.0)
Alkaline Phosphatase: 114 U/L (ref 38–126)
Anion gap: 7 (ref 5–15)
BUN: <5 mg/dL — ABNORMAL LOW (ref 6–20)
CO2: 29 mmol/L (ref 22–32)
Calcium: 8.9 mg/dL (ref 8.9–10.3)
Chloride: 98 mmol/L (ref 98–111)
Creatinine, Ser: 0.65 mg/dL (ref 0.61–1.24)
GFR, Estimated: >60 mL/min (ref >60)
Glucose, Bld: 110 mg/dL — ABNORMAL HIGH (ref 70–99)
Potassium: 4.5 mmol/L (ref 3.5–5.1)
Sodium: 134 mmol/L — ABNORMAL LOW (ref 135–145)
Total Bilirubin: 0.9 mg/dL (ref 0.3–1.2)
Total Protein: 5.9 g/dL — ABNORMAL LOW (ref 6.5–8.1)

## 2021-07-05 LAB — C-REACTIVE PROTEIN: CRP: 19.6 mg/dL — ABNORMAL HIGH (ref <1.0)

## 2021-07-05 MED ORDER — ONDANSETRON 4 MG PO TBDP: 4.0000 mg | ORAL_TABLET | Freq: Once | ORAL | 0 refills | Status: AC

## 2021-07-05 MED ORDER — OXYCODONE HCL 5 MG PO TABS: 5.0000 mg | ORAL_TABLET | ORAL | 0 refills | Status: DC | PRN

## 2021-07-05 MED ORDER — ADULT MULTIVITAMIN W/MINERALS CH: 1.0000 | ORAL_TABLET | Freq: Every day | ORAL | 0 refills | Status: AC

## 2021-07-05 MED ORDER — NICOTINE 21 MG/24HR TD PT24: 21.0000 mg | MEDICATED_PATCH | Freq: Every day | TRANSDERMAL | 0 refills | Status: DC

## 2021-07-05 NOTE — TOC Transition Note (Signed)
Transition of Care [facility]) - CM/SW Discharge Note   Patient Details  Name: Edward Wall MRN: [id number] Date of Birth: December 30, 1985  [facility] of Care [facility] CM/SW Contact:  Bartholome Bill, RN Phone Number: 07/05/2021, 2:07 PM   Clinical Narrative:    ETOH and Primary Care provider resources placed on AVS.

## 2021-07-05 NOTE — Discharge Summary (Signed)
Physician Discharge Summary  Edward Wall GDJ:[id number] DOB: 31-Dec-1985 DOA: 07/01/2021  PCP: Patient, No Pcp Per (Inactive)  Admit date: 07/01/2021 Discharge date: 07/05/2021  Admitted From: Home Disposition: Home  Recommendations for Outpatient Follow-up:  Follow up with PCP in 1-2 weeks Please obtain BMP/CBC in one week  Discharge Condition: Stable CODE STATUS: Full Diet recommendation: Full liquid diet  Brief/Interim Summary: He was admitted for first episode of acute alcoholic pancreatitis with pseudocyst. Started on IVF, NPO, IV analgesics and antiemetics.   Assessment & Plan:   Acute-subacute alcoholic pancreatitis with pseudocyst:  - Likely subacute given lipase WNL and pseudocyst development of pancreatitis.  - Atypical clinical syndrome/pain distribution is likely related to more retroperitoneal location and associated colonic inflammation. Normal triglycerides, no gallstones on U/S to suggest alternative etiologies.  -Tolerating clear liquid diet, advance to full liquids over the next few days at home otherwise symptoms well controlled continue pain management as below. -Lengthy discussion about alcohol cessation, patient seems willing and agreeable to quit drinking alcohol, close follow-up with PCP in the next 1 to 2 weeks(of note patient does not have a PCP and will need to follow-up with new PCP for establishment of care as discussed at bedside)  Alcohol abuse: EtOH level negative on arrival. No evidence of withdrawal. - Cessation counseling provided and will be ongoing.  - Empiric thiamine, MVM at discharge   Hepatic steatosis: D/w pt and mother. This is due to alcohol abuse.    Macrocytosis: This is due to alcohol abuse/hepatic damage.  - With low-normal B12, initiated IM supplementation while admitted.   UTI ruled out: Negative urine culture, no urinary symptoms.    Tobacco use:  - Nicotine patch, cessation counseling   Hypokalemia, hypomagnesemia:  Improved.    Hypophosphatemia:  - Supplemented, resolved.    Discharge Instructions  Discharge Instructions     Call MD for:  persistant nausea and vomiting   Complete by: As directed    Diet - low sodium heart healthy   Complete by: As directed    Increase activity slowly   Complete by: As directed       Allergies as of 07/05/2021   No Known Allergies      Medication List     STOP taking these medications    cephALEXin 500 MG capsule Commonly known as: KEFLEX   Ibuprofen 200 MG Caps       TAKE these medications    multivitamin with minerals Tabs tablet Take 1 tablet by mouth daily. Start taking on: July 06, 2021   nicotine 21 mg/24hr patch Commonly known as: NICODERM CQ - dosed in mg/24 hours Place 1 patch (21 mg total) onto the skin daily. Start taking on: July 06, 2021   ondansetron 4 MG disintegrating tablet Commonly known as: ZOFRAN-ODT Take 1 tablet (4 mg total) by mouth once for 1 dose.   oxyCODONE 5 MG immediate release tablet Commonly known as: Oxy IR/ROXICODONE Take 1 tablet (5 mg total) by mouth every 4 (four) hours as needed for severe pain. What changed:  when to take this reasons to take this        No Known Allergies  Consultations: None  Procedures/Studies: CT ABDOMEN PELVIS W CONTRAST  Result Date: 07/02/2021 CLINICAL DATA:  Abdominal pain, nausea/vomiting/diarrhea EXAM: CT ABDOMEN AND PELVIS WITH CONTRAST TECHNIQUE: Multidetector CT imaging of the abdomen and pelvis was performed using the standard protocol following bolus administration of intravenous contrast. CONTRAST:  47mL OMNIPAQUE IOHEXOL 350 MG/ML SOLN COMPARISON:  07/16/2019 FINDINGS: Motion degraded images. Lower chest: Mild left basilar atelectasis. Hepatobiliary: Liver is within normal limits. Gallbladder is unremarkable. No intrahepatic or extrahepatic ductal dilatation. Pancreas: 3.9 x 3.0 cm thick-walled fluid collection along the pancreatic tail with  peripancreatic inflammatory changes/fluid (series 2/image 22), favoring acute or subacute pancreatitis with a pancreatic pseudocyst. Spleen: Within normal limits. Adrenals/Urinary Tract: Adrenal glands are within normal limits. Kidneys are notable for excretory contrast in the bilateral renal collecting systems. No hydronephrosis. Layering contrast in the bladder, otherwise within normal limits. Stomach/Bowel: Stomach is within normal limits. No evidence of bowel obstruction. Normal appendix (coronal image 78). Wall thickening with pericolonic inflammatory changes involving the proximal descending colon in the left upper abdomen (series 2/image 27), adjacent to the pancreatic tail, favored to be secondary to the patient's recent acute pancreatitis. Vascular/Lymphatic: No evidence of abdominal aortic aneurysm. No suspicious abdominopelvic lymphadenopathy. Reproductive: Prostate is unremarkable. Other: No abdominopelvic ascites. No free air. Musculoskeletal: Visualized osseous structures are within normal limits. IMPRESSION: Normal appendix. Suspected acute/subacute pancreatitis with a 3.9 cm pseudocyst in the pancreatic tail. Adjacent pericolonic inflammatory changes/wall thickening involving the proximal descending colon, favored to be secondary to the pancreatic inflammation. Consider follow-up CT or MR abdomen with/without contrast in 4-6 weeks to document improvement/resolution. Electronically Signed   By: Charline Bills M.D.   On: 07/02/2021 01:39   US Abdomen Limited RUQ (LIVER/GB)  Result Date: 07/02/2021 CLINICAL DATA:  Acute pancreatitis. EXAM: ULTRASOUND ABDOMEN LIMITED RIGHT UPPER QUADRANT COMPARISON:  None. FINDINGS: Gallbladder: No gallstones or gallbladder wall thickening. No pericholecystic fluid. The sonographer reports no sonographic Murphy's sign. Common bile duct: Diameter: 3 mm Liver: Diffuse increased echogenicity of liver parenchyma identified without focal abnormality. Portal vein is  patent on color Doppler imaging with normal direction of blood flow towards the liver. Other: None. IMPRESSION: No evidence for cholelithiasis. Diffusely increased echogenicity of liver parenchyma suggests fatty deposition. Electronically Signed   By: Kennith Center M.D.   On: 07/02/2021 06:42     Subjective: No acute issues or events overnight tolerating p.o. well requesting discharge home which is certainly reasonable given resolution of symptoms   Discharge Exam: Vitals:   07/04/21 2240 07/05/21 0532  BP:  (!) 123/91  Pulse:  91  Resp:  14  Temp: 99.3 F (37.4 C) 99.3 F (37.4 C)  SpO2:  98%   Vitals:   07/04/21 1317 07/04/21 2119 07/04/21 2240 07/05/21 0532  BP: (!) 132/97 (!) 138/104  (!) 123/91  Pulse: 78 87  91  Resp: 16 16  14   Temp: 98.7 F (37.1 C) (!) 101.2 F (38.4 C) 99.3 F (37.4 C) 99.3 F (37.4 C)  TempSrc: Oral Oral Oral Oral  SpO2: 98% 98%  98%    General: Pt is alert, awake, not in acute distress Cardiovascular: RRR, S1/S2 +, no rubs, no gallops Respiratory: CTA bilaterally, no wheezing, no rhonchi Abdominal: Soft, NT, ND, bowel sounds + Extremities: no edema, no cyanosis    The results of significant diagnostics from this hospitalization (including imaging, microbiology, ancillary and laboratory) are listed below for reference.     Microbiology: Recent Results (from the past 240 hour(s))  Urine Culture     Status: None   Collection Time: 07/01/21 11:15 PM   Specimen: Urine, Clean Catch  Result Value Ref Range Status   Specimen Description   Final    URINE, CLEAN CATCH Performed at [facility], [address], [place], [place]    Special Requests  Final    NONE Performed at [facility], [address] [place], [place] [address]    Culture   Final    NO GROWTH Performed at [facility], [address], [place], [place] [address]    Report Status 07/03/2021 FINAL  Final  Resp Panel  by RT-PCR (Flu A&B, Covid) Nasopharyngeal Swab     Status: None   Collection Time: 07/02/21  2:00 AM   Specimen: Nasopharyngeal Swab; Nasopharyngeal(NP) swabs in vial transport medium  Result Value Ref Range Status   SARS Coronavirus 2 by RT PCR NEGATIVE NEGATIVE Final    Comment: (NOTE) SARS-CoV-2 target nucleic acids are NOT DETECTED.  The SARS-CoV-2 RNA is generally detectable in upper respiratory specimens during the acute phase of infection. The lowest concentration of SARS-CoV-2 viral copies this assay can detect is 138 copies/mL. A negative result does not preclude SARS-Cov-2 infection and should not be used as the sole basis for treatment or other patient management decisions. A negative result may occur with  improper specimen collection/handling, submission of specimen other than nasopharyngeal swab, presence of viral mutation(s) within the areas targeted by this assay, and inadequate number of viral copies(<138 copies/mL). A negative result must be combined with clinical observations, patient history, and epidemiological information. The expected result is Negative.  Fact Sheet for Patients:  BloggerCourse.com  Fact Sheet for Healthcare Providers:  SeriousBroker.it  This test is no t yet approved or cleared by the [place] FDA and  has been authorized for detection and/or diagnosis of SARS-CoV-2 by FDA under an Emergency Use Authorization (EUA). This EUA will remain  in effect (meaning this test can be used) for the duration of the COVID-19 declaration under Section 564(b)(1) of the Act, 21 U.S.C.section 360bbb-3(b)(1), unless the authorization is terminated  or revoked sooner.       Influenza A by PCR NEGATIVE NEGATIVE Final   Influenza B by PCR NEGATIVE NEGATIVE Final    Comment: (NOTE) The Xpert Xpress SARS-CoV-2/FLU/RSV plus assay is intended as an aid in the diagnosis of influenza from Nasopharyngeal swab  specimens and should not be used as a sole basis for treatment. Nasal washings and aspirates are unacceptable for Xpert Xpress SARS-CoV-2/FLU/RSV testing.  Fact Sheet for Patients: BloggerCourse.com  Fact Sheet for Healthcare Providers: SeriousBroker.it  This test is not yet approved or cleared by the [place] FDA and has been authorized for detection and/or diagnosis of SARS-CoV-2 by FDA under an Emergency Use Authorization (EUA). This EUA will remain in effect (meaning this test can be used) for the duration of the COVID-19 declaration under Section 564(b)(1) of the Act, 21 U.S.C. section 360bbb-3(b)(1), unless the authorization is terminated or revoked.  Performed at [facility], [address] [place], [place] [address]      Labs: BNP (last 3 results) No results for input(s): BNP in the last 8760 hours. Basic Metabolic Panel: Recent Labs  Lab 07/01/21 1852 07/02/21 0633 07/03/21 0445 07/04/21 0506 07/05/21 0530  NA 133* 131* 133* 137 134*  K 3.4* 3.3* 4.0 4.2 4.5  CL 94* 97* 97* 100 98  CO2 25 23 22 24 29   GLUCOSE 127* 91 74 76 110*  BUN 5* <5* <5* <5* <5*  CREATININE 0.66 0.63 0.64 0.51* 0.65  CALCIUM 9.5 8.2* 8.6* 9.1 8.9  MG  --  1.5* 1.8 1.8  --   PHOS  --   --  2.3* 2.5  --    Liver Function Tests:  Recent Labs  Lab 07/01/21 1852 07/02/21 0633 07/03/21 0445 07/04/21 0506 07/05/21 0530  AST 30 18 18 22  58*  ALT 27 19 16 17  33  ALKPHOS 94 72 74 78 114  BILITOT 1.8* 1.3* 1.6* 1.6* 0.9  PROT 7.1 5.9* 5.8* 6.1* 5.9*  ALBUMIN 3.8 3.0* 2.8* 3.0* 2.9*   Recent Labs  Lab 07/01/21 1852 07/02/21 0633  LIPASE 24 24   No results for input(s): AMMONIA in the last 168 hours. CBC: Recent Labs  Lab 07/01/21 1852 07/02/21 0633 07/03/21 0445 07/04/21 0506 07/05/21 0530  WBC 13.8* 14.3* 14.4* 11.7* 9.2  NEUTROABS 11.1* 10.8* 10.8* 8.0* 5.8  HGB 15.2 14.2 13.7 13.9 14.3  HCT  42.7 41.5 38.9* 39.5 40.4  MCV 101.2* 104.0* 101.8* 101.5* 101.3*  PLT 192 172 185 214 271   Cardiac Enzymes: No results for input(s): CKTOTAL, CKMB, CKMBINDEX, TROPONINI in the last 168 hours. BNP: Invalid input(s): POCBNP CBG: Recent Labs  Lab 07/03/21 0009  GLUCAP 78   D-Dimer No results for input(s): DDIMER in the last 72 hours. Hgb A1c No results for input(s): HGBA1C in the last 72 hours. Lipid Profile No results for input(s): CHOL, HDL, LDLCALC, TRIG, CHOLHDL, LDLDIRECT in the last 72 hours. Thyroid function studies No results for input(s): TSH, T4TOTAL, T3FREE, THYROIDAB in the last 72 hours.  Invalid input(s): FREET3 Anemia work up Recent Labs    07/03/21 0445  VITAMINB12 217  FOLATE 9.7   Urinalysis    Component Value Date/Time   COLORURINE YELLOW (A) 07/01/2021 2315   APPEARANCEUR HAZY (A) 07/01/2021 2315   LABSPEC 1.025 07/01/2021 2315   PHURINE 6.5 07/01/2021 2315   GLUCOSEU NEGATIVE 07/01/2021 2315   HGBUR NEGATIVE 07/01/2021 2315   BILIRUBINUR SMALL (A) 07/01/2021 2315   KETONESUR 80 (A) 07/01/2021 2315   PROTEINUR 30 (A) 07/01/2021 2315   NITRITE POSITIVE (A) 07/01/2021 2315   LEUKOCYTESUR NEGATIVE 07/01/2021 2315   Sepsis Labs Invalid input(s): PROCALCITONIN,  WBC,  LACTICIDVEN Microbiology Recent Results (from the past 240 hour(s))  Urine Culture     Status: None   Collection Time: 07/01/21 11:15 PM   Specimen: Urine, Clean Catch  Result Value Ref Range Status   Specimen Description   Final    URINE, CLEAN CATCH Performed at [facility], [address], [place], [place]    Special Requests   Final    NONE Performed at [facility], [address], [place], [place]    Culture   Final    NO GROWTH Performed at [facility], [address], [place], [address] [place]    Report Status 07/03/2021 FINAL  Final  Resp Panel by RT-PCR (Flu A&B, Covid) Nasopharyngeal Swab      Status: None   Collection Time: 07/02/21  2:00 AM   Specimen: Nasopharyngeal Swab; Nasopharyngeal(NP) swabs in vial transport medium  Result Value Ref Range Status   SARS Coronavirus 2 by RT PCR NEGATIVE NEGATIVE Final    Comment: (NOTE) SARS-CoV-2 target nucleic acids are NOT DETECTED.  The SARS-CoV-2 RNA is generally detectable in upper respiratory specimens during the acute phase of infection. The lowest concentration of SARS-CoV-2 viral copies this assay can detect is 138 copies/mL. A negative result does not preclude SARS-Cov-2 infection and should not be used as the sole basis for treatment or other patient management decisions. A negative result may occur with  improper specimen collection/handling, submission of specimen other than nasopharyngeal swab, presence  of viral mutation(s) within the areas targeted by this assay, and inadequate number of viral copies(<138 copies/mL). A negative result must be combined with clinical observations, patient history, and epidemiological information. The expected result is Negative.  Fact Sheet for Patients:  BloggerCourse.com  Fact Sheet for Healthcare Providers:  SeriousBroker.it  This test is no t yet approved or cleared by the [place] FDA and  has been authorized for detection and/or diagnosis of SARS-CoV-2 by FDA under an Emergency Use Authorization (EUA). This EUA will remain  in effect (meaning this test can be used) for the duration of the COVID-19 declaration under Section 564(b)(1) of the Act, 21 U.S.C.section 360bbb-3(b)(1), unless the authorization is terminated  or revoked sooner.       Influenza A by PCR NEGATIVE NEGATIVE Final   Influenza B by PCR NEGATIVE NEGATIVE Final    Comment: (NOTE) The Xpert Xpress SARS-CoV-2/FLU/RSV plus assay is intended as an aid in the diagnosis of influenza from Nasopharyngeal swab specimens and should not be used as a sole basis  for treatment. Nasal washings and aspirates are unacceptable for Xpert Xpress SARS-CoV-2/FLU/RSV testing.  Fact Sheet for Patients: BloggerCourse.com  Fact Sheet for Healthcare Providers: SeriousBroker.it  This test is not yet approved or cleared by the [place] FDA and has been authorized for detection and/or diagnosis of SARS-CoV-2 by FDA under an Emergency Use Authorization (EUA). This EUA will remain in effect (meaning this test can be used) for the duration of the COVID-19 declaration under Section 564(b)(1) of the Act, 21 U.S.C. section 360bbb-3(b)(1), unless the authorization is terminated or revoked.  Performed at [facility], [address] [place], [place] [address]      Time coordinating discharge: Over 30 minutes  SIGNED:   Azucena Fallen, DO [facility] 07/05/2021, 11:12 AM Pager   If 7PM-7AM, please contact night-coverage [email or web address]

## 2021-07-05 NOTE — Progress Notes (Signed)
Patient discharged to home with family, discharge instructions reviewed with patient who verbalized understanding. Patient given information on recourses for Alcohol treatment and for PCP to establish care and for follow up from hospital admission.

## 2021-08-10 ENCOUNTER — Encounter (HOSPITAL_COMMUNITY): Payer: Self-pay | Admitting: Radiology

## 2021-12-12 IMAGING — CR DG WRIST COMPLETE 3+V*R*
4 series · 4 of 4 positions shown · non-contrast
Comparison: Most recent radiograph 07/16/2019

CLINICAL DATA: Wrist numbness. Right wrist pain and decreased
mobility. Prior fracture and surgical repair.

EXAM:
RIGHT WRIST - COMPLETE 3+ VIEW

[x wrist pa right]
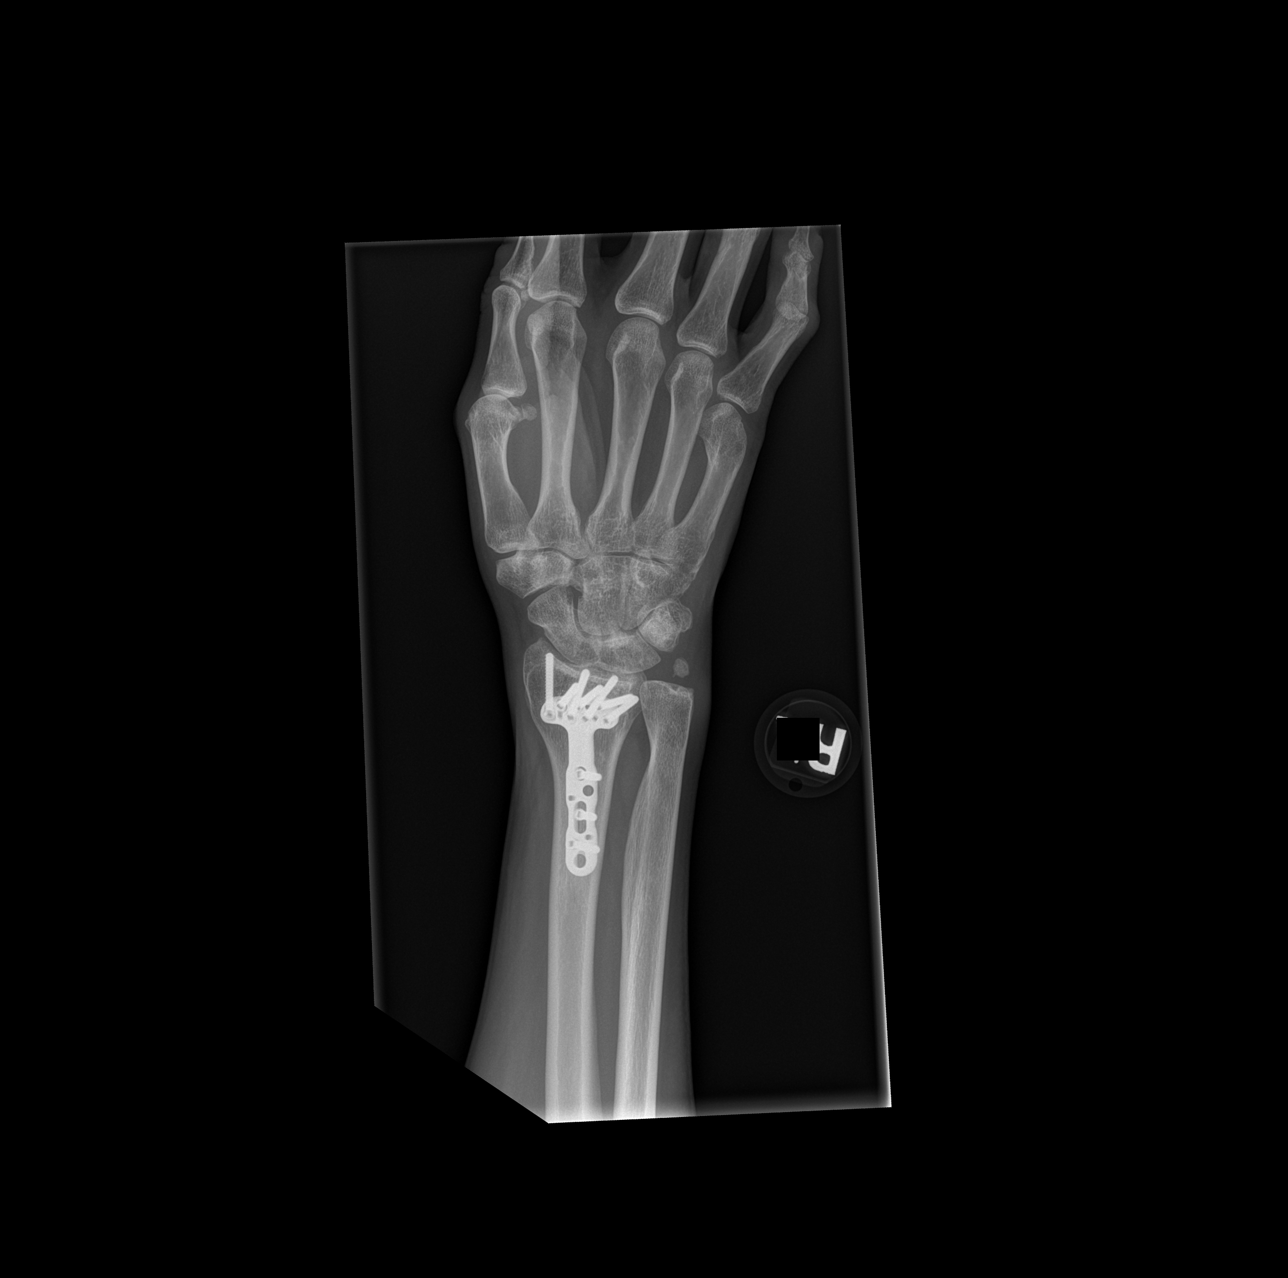

[x wrist obl right]
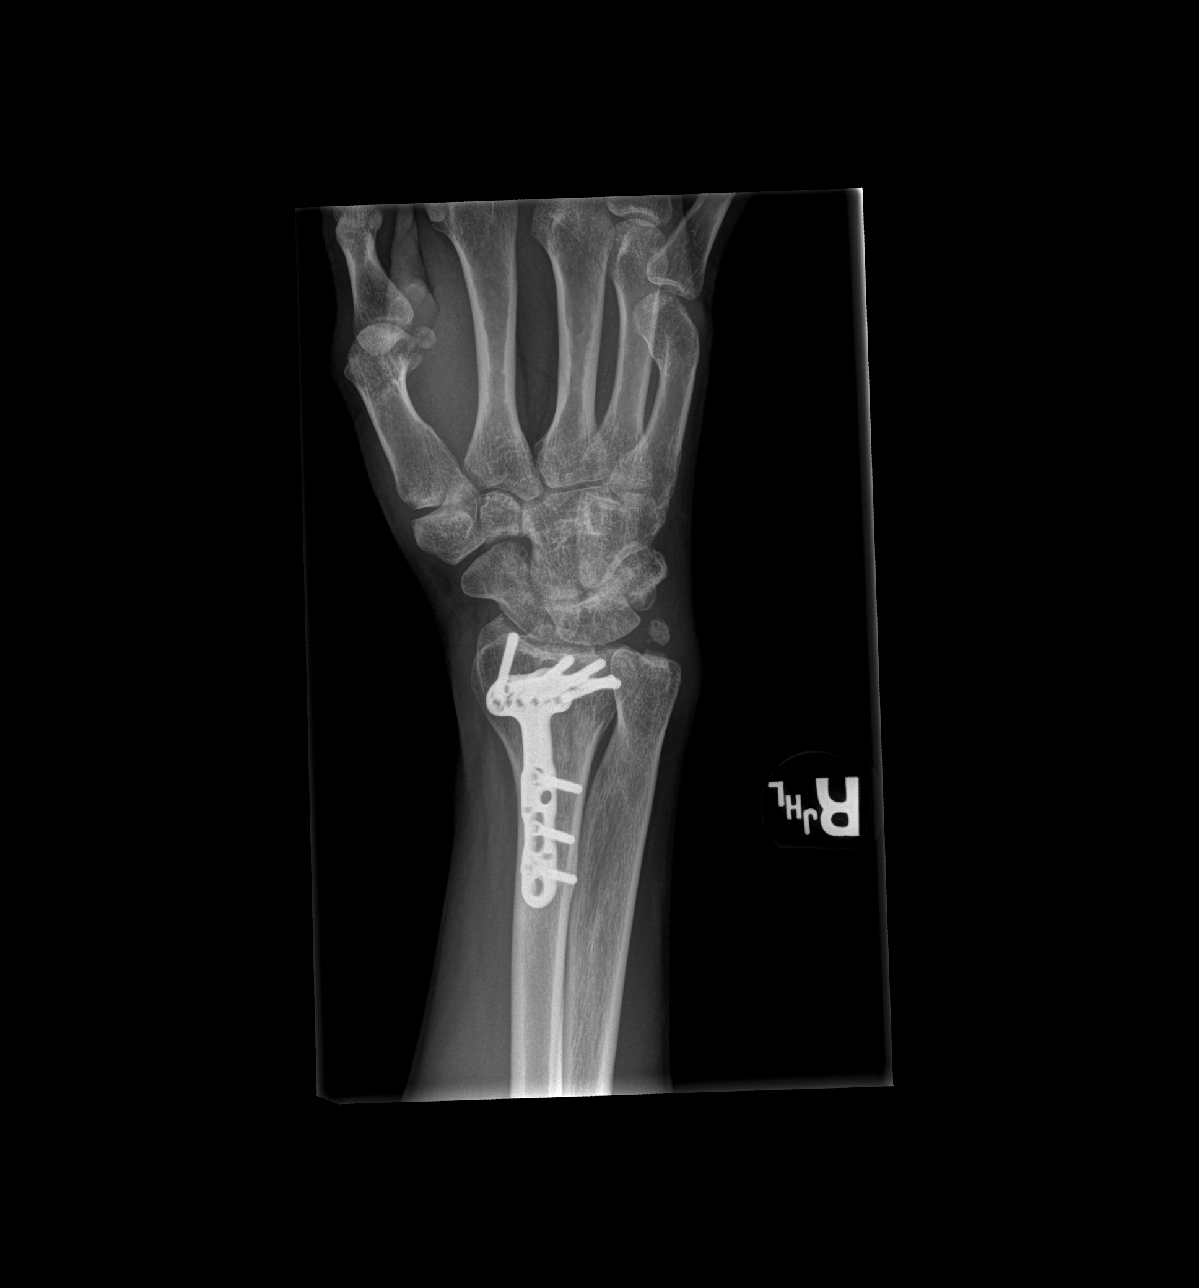

[x wrist lat right]
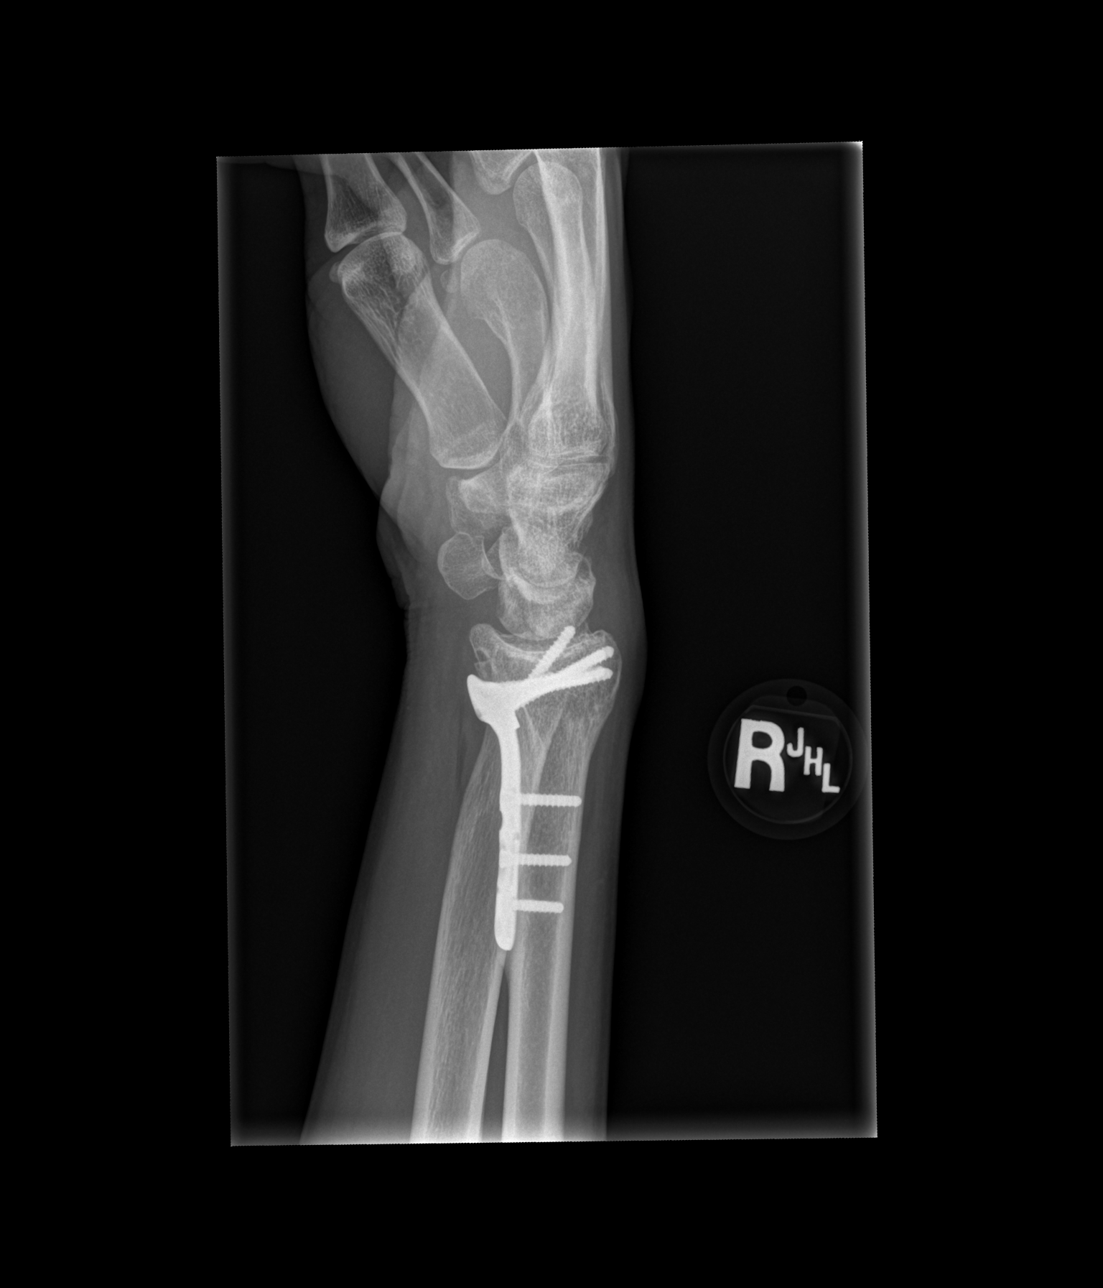

[x wrist navicular view right]
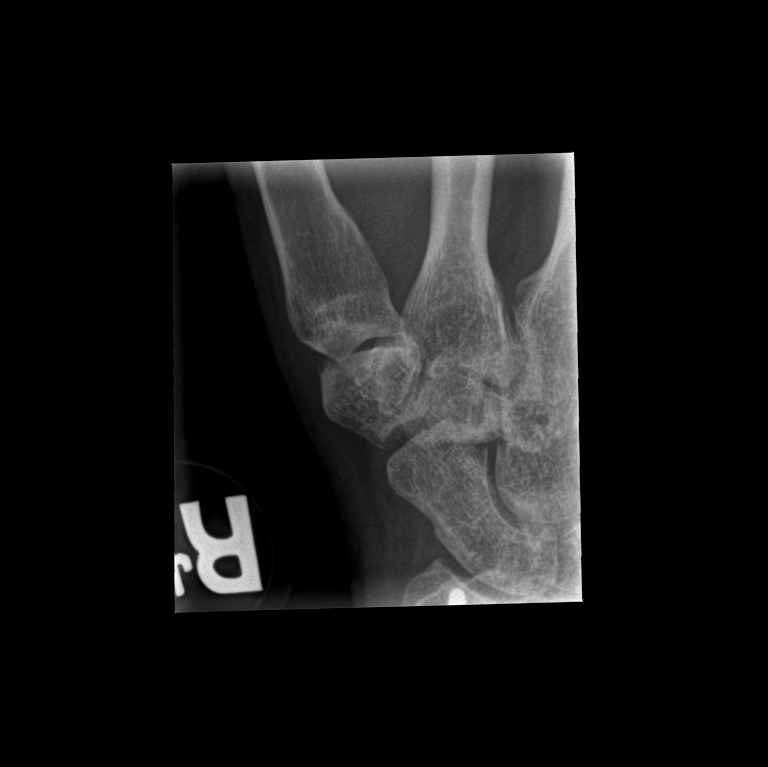

[4 of 4 positions shown; findings below may reference images not displayed]

FINDINGS: Volar plate and multi screw fixation of previous distal radius
fracture. The fracture has healed. The hardware is intact. There is
no periprosthetic lucency. No acute fracture. Remote ulna styloid
fracture. Remote fifth metacarpal fracture. No erosion or bony
destruction. Soft tissues are unremarkable.
IMPRESSION: 1. Volar plate and screw fixation of previous distal radius
fracture. The hardware is intact. No acute osseous abnormality.
2. Remote ulna styloid fracture.  Remote fifth metacarpal fracture.

## 2022-09-09 IMAGING — CT CT ABD-PELV W/ CM
2 of 4 series · 15 of 46 positions shown, 17 images · IV contrast (omnipaque)
Comparison: 07/16/2019

CLINICAL DATA: Abdominal pain, nausea/vomiting/diarrhea

EXAM:
CT ABDOMEN AND PELVIS WITH CONTRAST
TECHNIQUE: Multidetector CT imaging of the abdomen and pelvis was performed
using the standard protocol following bolus administration of
intravenous contrast.
CONTRAST:  80mL OMNIPAQUE IOHEXOL 350 MG/ML SOLN

[Series 2: axial st · axial · 0.66mm/px · z∈[-491,-76]mm · 12 of 95 slices shown, 14 images]
[im 6/95  soft-tissue]
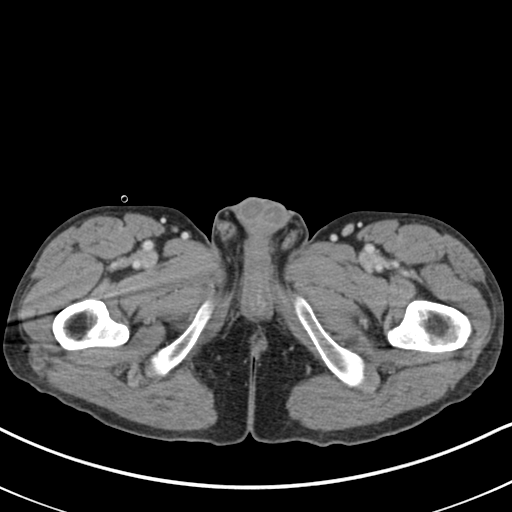
[im 6/95  bone]
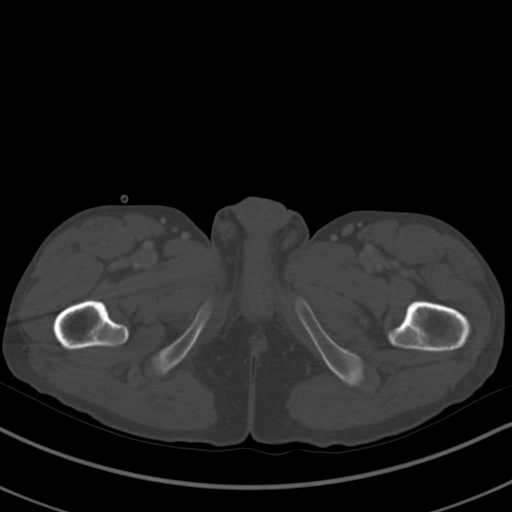
[im 16/95  soft-tissue]
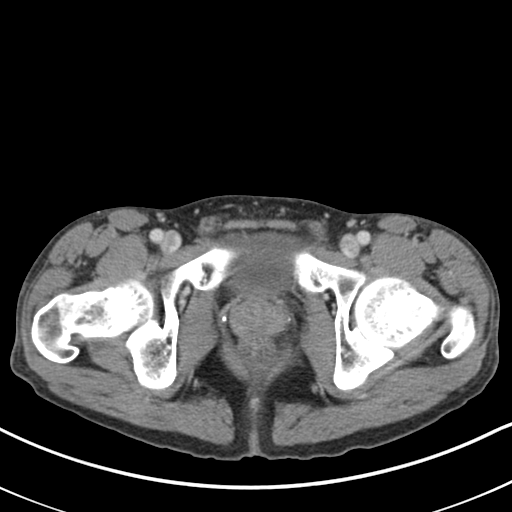
[im 21/95  soft-tissue]
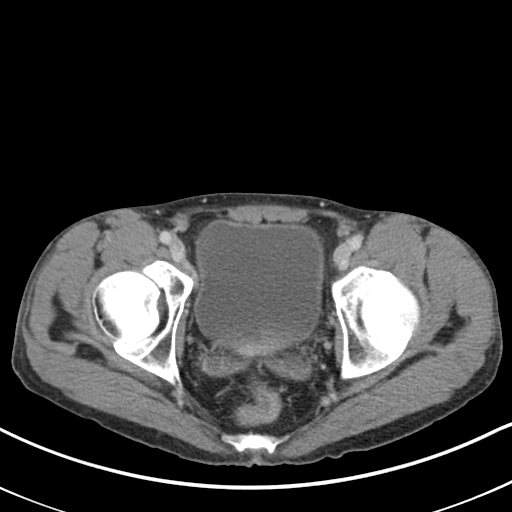
[im 27/95  soft-tissue]
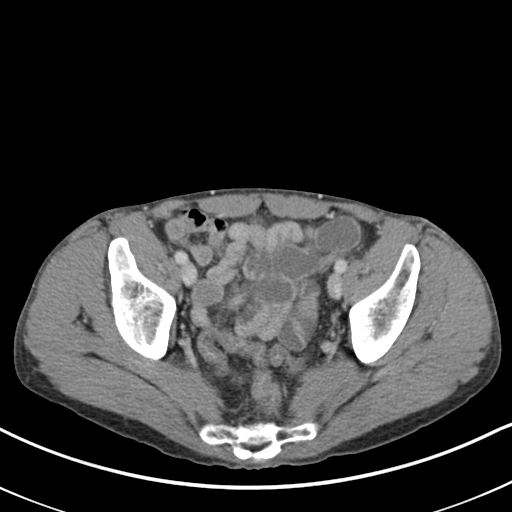
[im 37/95  soft-tissue]
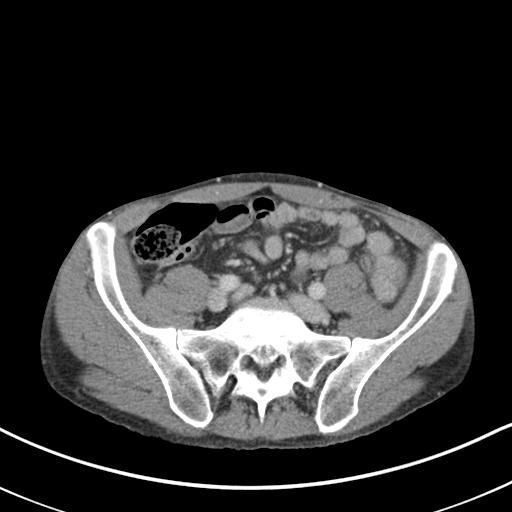
[im 42/95  soft-tissue]
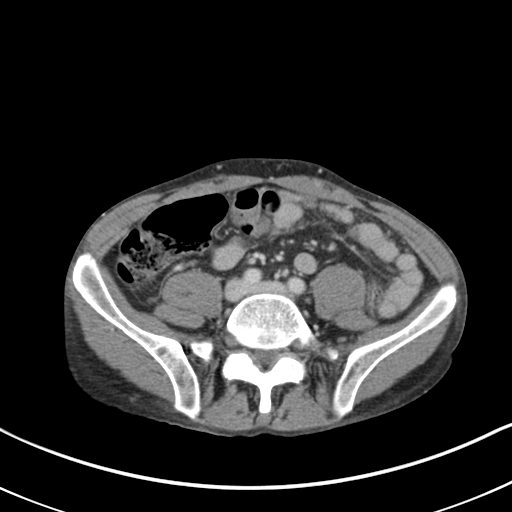
[im 53/95  soft-tissue]
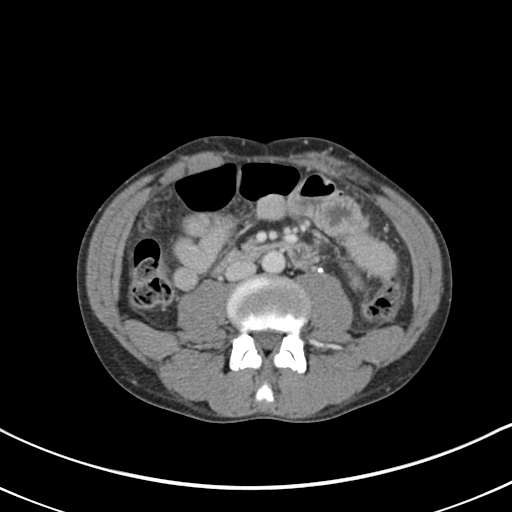
[im 58/95  soft-tissue]
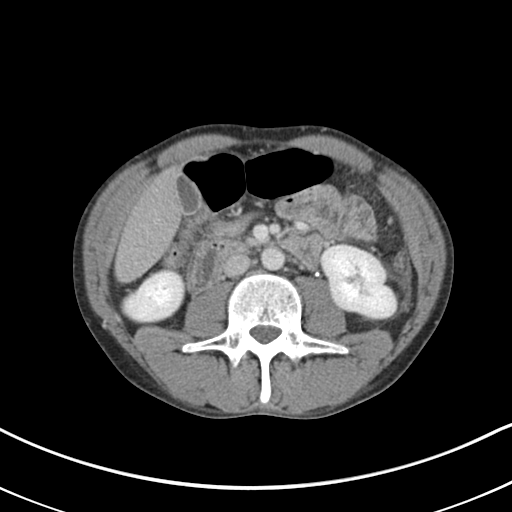
[im 68/95  soft-tissue]
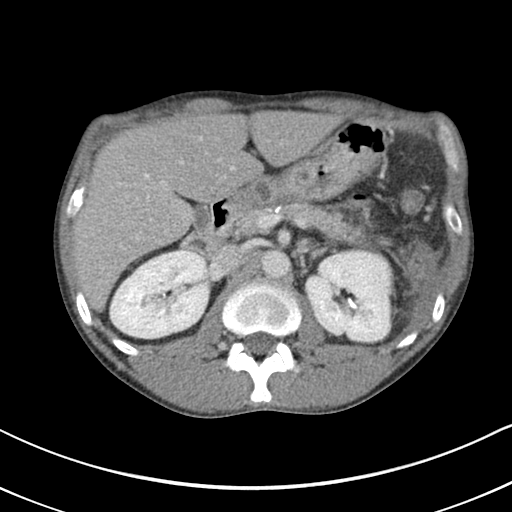
[im 68/95  bone]
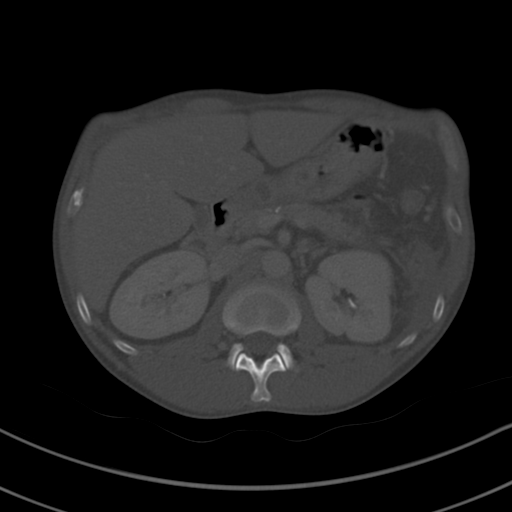
[im 74/95  soft-tissue]
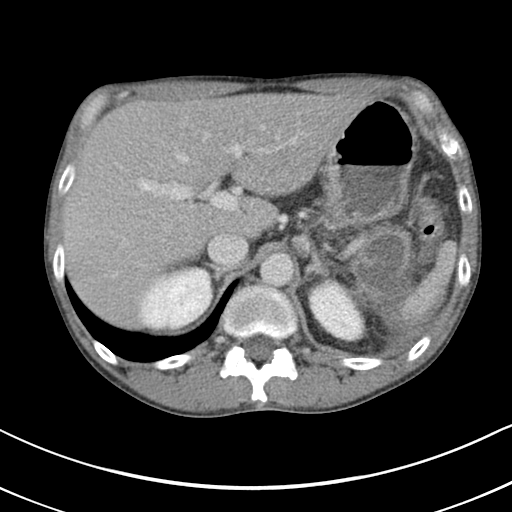
[im 79/95  soft-tissue]
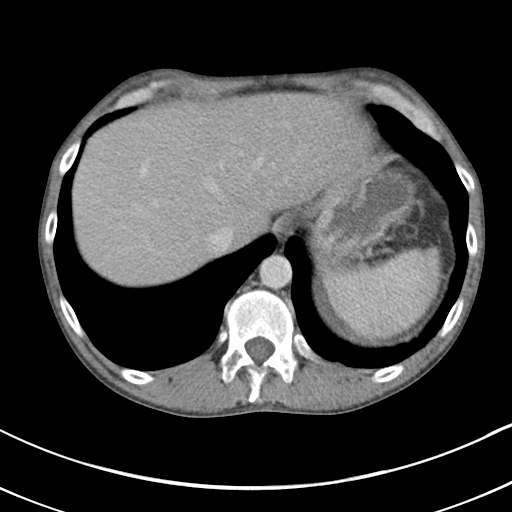
[im 89/95  soft-tissue]
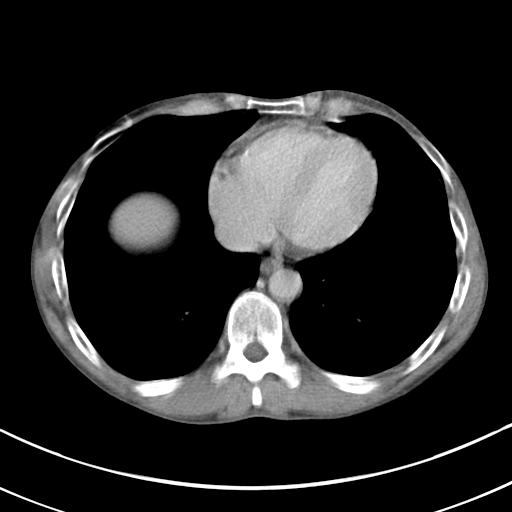

[Series 5: coronal st · coronal · 0.67mm/px · 3 of 143 slices shown]
[im 48/143  soft-tissue]
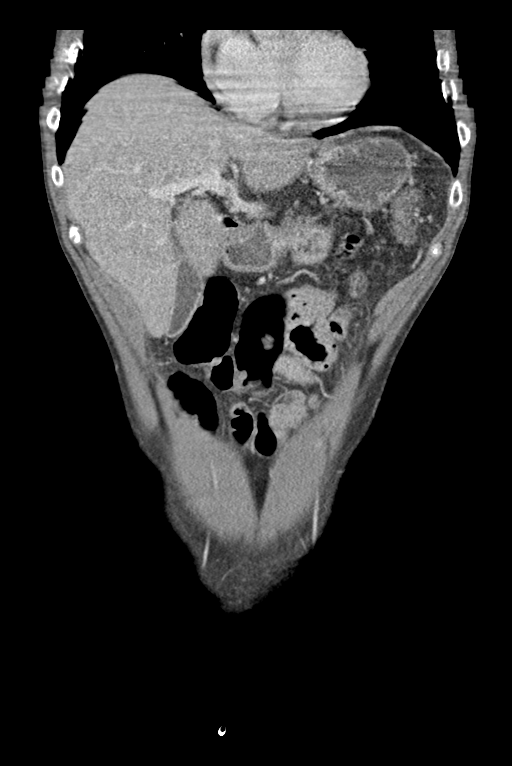
[im 64/143  soft-tissue]
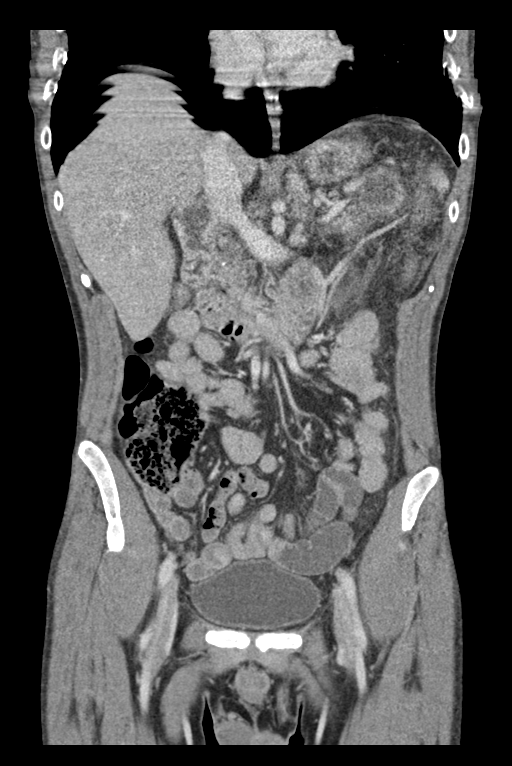
[im 79/143  soft-tissue]
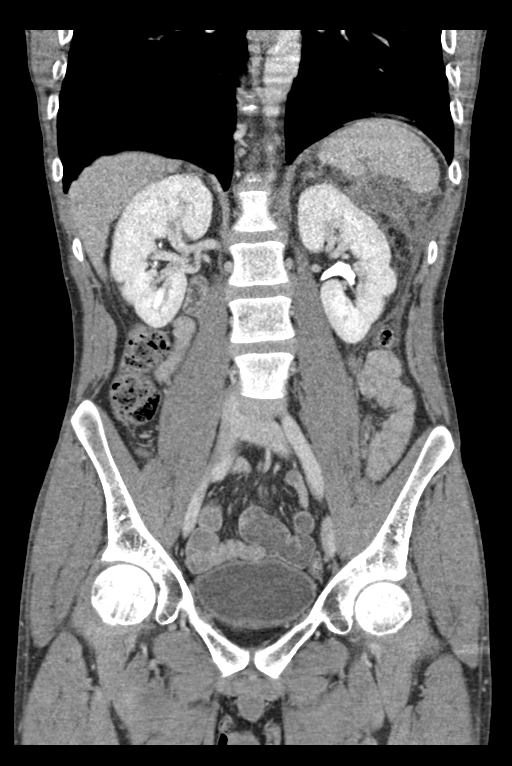

[15 of 46 positions shown; findings below may reference images not displayed]

FINDINGS: Motion degraded images.

Lower chest: Mild left basilar atelectasis.

Hepatobiliary: Liver is within normal limits.

Gallbladder is unremarkable. No intrahepatic or extrahepatic ductal
dilatation.

Pancreas: 3.9 x 3.0 cm thick-walled fluid collection along the
pancreatic tail with peripancreatic inflammatory changes/fluid
(series 2/image 22), favoring acute or subacute pancreatitis with a
pancreatic pseudocyst.

Spleen: Within normal limits.

Adrenals/Urinary Tract: Adrenal glands are within normal limits.

Kidneys are notable for excretory contrast in the bilateral renal
collecting systems. No hydronephrosis.

Layering contrast in the bladder, otherwise within normal limits.

Stomach/Bowel: Stomach is within normal limits.

No evidence of bowel obstruction.

Normal appendix (coronal image 78).

Wall thickening with pericolonic inflammatory changes involving the
proximal descending colon in the left upper abdomen (series 2/image
27), adjacent to the pancreatic tail, favored to be secondary to the
patient's recent acute pancreatitis.

Vascular/Lymphatic: No evidence of abdominal aortic aneurysm.

No suspicious abdominopelvic lymphadenopathy.

Reproductive: Prostate is unremarkable.

Other: No abdominopelvic ascites.

No free air.

Musculoskeletal: Visualized osseous structures are within normal
limits.
IMPRESSION: Normal appendix.

Suspected acute/subacute pancreatitis with a 3.9 cm pseudocyst in
the pancreatic tail.

Adjacent pericolonic inflammatory changes/wall thickening involving
the proximal descending colon, favored to be secondary to the
pancreatic inflammation.

Consider follow-up CT or MR abdomen with/without contrast in 4-6
weeks to document improvement/resolution.

## 2023-06-12 ENCOUNTER — Ambulatory Visit: Payer: Self-pay | Admitting: Family Medicine

## 2023-06-12 ENCOUNTER — Ambulatory Visit: Payer: Medicaid Other | Admitting: Family Medicine

## 2023-07-17 ENCOUNTER — Ambulatory Visit (INDEPENDENT_AMBULATORY_CARE_PROVIDER_SITE_OTHER): Payer: Self-pay | Admitting: Family Medicine

## 2023-07-17 ENCOUNTER — Encounter: Payer: Self-pay | Admitting: Family Medicine

## 2023-07-17 VITALS — BP 120/90 | HR 72 | Temp 97.6°F | Ht 69.0 in | Wt 129.0 lb

## 2023-07-17 DIAGNOSIS — Z113 Encounter for screening for infections with a predominantly sexual mode of transmission: Secondary | ICD-10-CM | POA: Insufficient documentation

## 2023-07-17 DIAGNOSIS — F1020 Alcohol dependence, uncomplicated: Secondary | ICD-10-CM

## 2023-07-17 DIAGNOSIS — Z0001 Encounter for general adult medical examination with abnormal findings: Secondary | ICD-10-CM

## 2023-07-17 DIAGNOSIS — Z114 Encounter for screening for human immunodeficiency virus [HIV]: Secondary | ICD-10-CM

## 2023-07-17 DIAGNOSIS — Z1159 Encounter for screening for other viral diseases: Secondary | ICD-10-CM

## 2023-07-17 DIAGNOSIS — Z0000 Encounter for general adult medical examination without abnormal findings: Secondary | ICD-10-CM | POA: Insufficient documentation

## 2023-07-17 LAB — CBC WITH DIFFERENTIAL/PLATELET
Absolute Monocytes: 452 cells/uL (ref 200–950)
Basophils Absolute: 73 cells/uL (ref 0–200)
Basophils Relative: 1.4 %
Eosinophils Absolute: 130 cells/uL (ref 15–500)
Eosinophils Relative: 2.5 %
HCT: 45.3 % (ref 38.5–50.0)
Hemoglobin: 15.7 g/dL (ref 13.2–17.1)
Lymphs Abs: 2662 cells/uL (ref 850–3900)
MCH: 34.8 pg — ABNORMAL HIGH (ref 27.0–33.0)
MCHC: 34.7 g/dL (ref 32.0–36.0)
MCV: 100.4 fL — ABNORMAL HIGH (ref 80.0–100.0)
MPV: 9.6 fL (ref 7.5–12.5)
Monocytes Relative: 8.7 %
Neutro Abs: 1882 cells/uL (ref 1500–7800)
Neutrophils Relative %: 36.2 %
Platelets: 203 10*3/uL (ref 140–400)
RBC: 4.51 10*6/uL (ref 4.20–5.80)
RDW: 13.6 % (ref 11.0–15.0)
Total Lymphocyte: 51.2 %
WBC: 5.2 10*3/uL (ref 3.8–10.8)

## 2023-07-17 NOTE — Assessment & Plan Note (Signed)

## 2023-07-17 NOTE — Assessment & Plan Note (Addendum)
Discussed health risks of alcohol abuse and enocuraged he seek outpatient treatment. He has completed a program in the past. I recommended local AA or [facility] treatment program. He declines at this time but I have strongly encouraged this. Will follow up in 3 months.

## 2023-07-17 NOTE — Assessment & Plan Note (Signed)
History of trichomoniasis. Would like testing done today for STIs

## 2023-07-17 NOTE — Progress Notes (Signed)
New Patient Office Visit  Subjective    Patient ID: Edward Wall, male    DOB: April 13, 1986  Age: 37 y.o. MRN: [id number]  CC:  Chief Complaint  Patient presents with   Establish Care    HPI Edward Wall presents to establish care. Oriented to practice routines and expectations. PMH includes alcohol dependence. Current concerns include STI testing. Edward Wall does report he drinks liquor every day throughout the day, he has quit in the past using a treatment program but has resumed drinking. He is unsure how much alcohol he drinks daily, it does not interfere with daily activities, he does not wish to quit drinking or smoking at this time.   Tobacco: smoker  (0.5 ppd x 15 yrs) STI: consents Vaccines:  declines flu and covid    Outpatient Encounter Medications as of 07/17/2023  Medication Sig   Multiple Vitamin (MULTIVITAMIN WITH MINERALS) TABS tablet Take 1 tablet by mouth daily.   [DISCONTINUED] nicotine (NICODERM CQ - DOSED IN MG/24 HOURS) 21 mg/24hr patch Place 1 patch (21 mg total) onto the skin daily. (Patient not taking: Reported on 07/17/2023)   [DISCONTINUED] oxyCODONE (OXY IR/ROXICODONE) 5 MG immediate release tablet Take 1 tablet (5 mg total) by mouth every 4 (four) hours as needed for severe pain. (Patient not taking: Reported on 07/17/2023)   No facility-administered encounter medications on file as of 07/17/2023.    Past Medical History:  Diagnosis Date   Radial nerve palsy, right     Past Surgical History:  Procedure Laterality Date   ORIF WRIST FRACTURE Right 07/16/2019   Procedure: OPEN REDUCTION INTERNAL FIXATION (ORIF) WRIST FRACTURE;  Surgeon: Roby Lofts, MD;  Location: [facility] OR;  Service: Orthopedics;  Laterality: Right;    Family History  Problem Relation Age of Onset   Crohn's disease Mother    Leukemia Father     Social History   Socioeconomic History   Marital status: Single    Spouse name: Not on file   Number of children: Not on file    Years of education: Not on file   Highest education level: Not on file  Occupational History   Not on file  Tobacco Use   Smoking status: Every Day    Current packs/day: 0.50    Types: Cigarettes   Smokeless tobacco: Never  Vaping Use   Vaping status: Some Days  Substance and Sexual Activity   Alcohol use: Yes    Alcohol/week: 85.0 standard drinks of alcohol    Types: 85 Shots of liquor per week    Comment: drinks 1 gallon of whiskey a week   Drug use: Yes    Frequency: 7.0 times per week    Types: Marijuana   Sexual activity: Not on file  Other Topics Concern   Not on file  Social History Narrative   ** Merged History Encounter **       Social Determinants of Health   Financial Resource Strain: Not on file  Food Insecurity: Not on file  Transportation Needs: Not on file  Physical Activity: Not on file  Stress: Not on file  Social Connections: Not on file  Intimate Partner Violence: Not on file    Review of Systems  Constitutional: Negative.   HENT: Negative.    Eyes: Negative.   Respiratory: Negative.    Cardiovascular: Negative.   Gastrointestinal: Negative.   Genitourinary: Negative.   Musculoskeletal: Negative.   Skin: Negative.   Neurological: Negative.   Endo/Heme/Allergies: Negative.  Psychiatric/Behavioral: Negative.    All other systems reviewed and are negative.       Objective    BP (!) 120/90   Pulse 72   Temp 97.6 F (36.4 C) (Oral)   Ht 5\' 9"  (1.753 m)   Wt 129 lb (58.5 kg)   SpO2 96%   BMI 19.05 kg/m    Physical Exam Vitals and nursing note reviewed.  Constitutional:      Appearance: Normal appearance. He is normal weight.  HENT:     Head: Normocephalic and atraumatic.     Right Ear: Tympanic membrane, ear canal and external ear normal.     Left Ear: Tympanic membrane, ear canal and external ear normal.     Nose: Nose normal.     Mouth/Throat:     Mouth: Mucous membranes are moist.     Pharynx: Oropharynx is clear.   Eyes:     Extraocular Movements: Extraocular movements intact.     Right eye: Normal extraocular motion and no nystagmus.     Left eye: Normal extraocular motion and no nystagmus.     Conjunctiva/sclera: Conjunctivae normal.     Pupils: Pupils are equal, round, and reactive to light.  Cardiovascular:     Rate and Rhythm: Normal rate and regular rhythm.     Pulses: Normal pulses.     Heart sounds: Normal heart sounds.  Pulmonary:     Effort: Pulmonary effort is normal.     Breath sounds: Normal breath sounds.  Abdominal:     General: Bowel sounds are normal.     Palpations: Abdomen is soft.  Genitourinary:    Comments: Deferred using shared decision making Musculoskeletal:        General: Normal range of motion.     Cervical back: Normal range of motion and neck supple.  Skin:    General: Skin is warm and dry.     Capillary Refill: Capillary refill takes less than 2 seconds.  Neurological:     General: No focal deficit present.     Mental Status: He is alert. Mental status is at baseline.  Psychiatric:        Mood and Affect: Mood normal.        Speech: Speech normal.        Behavior: Behavior normal.        Thought Content: Thought content normal.        Cognition and Memory: Cognition and memory normal.        Judgment: Judgment normal.         Assessment & Plan:   Problem List Items Addressed This Visit     Physical exam, annual - Primary    Today your medical history was reviewed and routine physical exam with labs was performed. Recommend 150 minutes of moderate intensity exercise weekly and consuming a well-balanced diet. Advised to stop smoking if a smoker, avoid smoking if a non-smoker, limit alcohol consumption to 1 drink per day for women and 2 drinks per day for men, and avoid illicit drug use. Counseled on safe sex practices and offered STI testing today. Counseled on the importance of sunscreen use. Counseled in mental health awareness and when to seek  medical care. Vaccine maintenance discussed. Appropriate health maintenance items reviewed. Return to office in 1 year for annual physical exam.       Relevant Orders   CBC with Differential/Platelet   COMPLETE METABOLIC PANEL WITH GFR   Lipid panel   Routine screening for  STI (sexually transmitted infection)    History of trichomoniasis. Would like testing done today for STIs      Relevant Orders   RPR   Trichomonas vaginalis RNA, Ql,Males   C. trachomatis/N. gonorrhoeae RNA   Uncomplicated alcohol dependence (HCC)    Discussed health risks of alcohol abuse and enocuraged he seek outpatient treatment. He has completed a program in the past. I recommended local [facility] or [facility] treatment program. He declines at this time but I have strongly encouraged this. Will follow up in 3 months.      Other Visit Diagnoses     Screening for HIV (human immunodeficiency virus)       Relevant Orders   HIV Antibody (routine testing w rflx)   Need for hepatitis C screening test       Relevant Orders   Hepatitis C antibody       Return in about 1 year (around 07/16/2024) for annual physical with labs 1 week prior.   Park Meo, FNP

## 2023-07-17 NOTE — Patient Instructions (Signed)
It was great to meet you today and I'm excited to have you join the [facility] practice. I hope you had a positive experience today! If you feel so inclined, please feel free to recommend our practice to friends and family. Mila Merry, FNP-C

## 2023-07-19 LAB — COMPLETE METABOLIC PANEL WITH GFR
AG Ratio: 2 (calc) (ref 1.0–2.5)
ALT: 61 U/L — ABNORMAL HIGH (ref 9–46)
AST: 135 U/L — ABNORMAL HIGH (ref 10–40)
Albumin: 4.9 g/dL (ref 3.6–5.1)
Alkaline phosphatase (APISO): 83 U/L (ref 36–130)
BUN: 7 mg/dL (ref 7–25)
CO2: 28 mmol/L (ref 20–32)
Calcium: 9.3 mg/dL (ref 8.6–10.3)
Chloride: 101 mmol/L (ref 98–110)
Creat: 0.77 mg/dL (ref 0.60–1.26)
Globulin: 2.5 g/dL (ref 1.9–3.7)
Glucose, Bld: 71 mg/dL (ref 65–99)
Potassium: 4.1 mmol/L (ref 3.5–5.3)
Sodium: 143 mmol/L (ref 135–146)
Total Bilirubin: 0.8 mg/dL (ref 0.2–1.2)
Total Protein: 7.4 g/dL (ref 6.1–8.1)
eGFR: 118 mL/min/{1.73_m2} (ref >=60)

## 2023-07-19 LAB — C. TRACHOMATIS/N. GONORRHOEAE RNA
C. trachomatis RNA, TMA: NOT DETECTED
N. gonorrhoeae RNA, TMA: NOT DETECTED

## 2023-07-19 LAB — LIPID PANEL
Cholesterol: 312 mg/dL — ABNORMAL HIGH (ref <200)
HDL: 139 mg/dL (ref >=40)
LDL Cholesterol (Calc): 153 mg/dL — ABNORMAL HIGH
Non-HDL Cholesterol (Calc): 173 mg/dL — ABNORMAL HIGH (ref <130)
Total CHOL/HDL Ratio: 2.2 (calc) (ref <5.0)
Triglycerides: 91 mg/dL (ref <150)

## 2023-07-19 LAB — TRICHOMONAS VAGINALIS RNA, QL,MALES: Trichomonas vaginalis RNA: NOT DETECTED

## 2023-07-19 LAB — HIV ANTIBODY (ROUTINE TESTING W REFLEX): HIV 1&2 Ab, 4th Generation: NONREACTIVE

## 2023-07-19 LAB — RPR: RPR Ser Ql: NONREACTIVE

## 2023-07-19 LAB — HEPATITIS C ANTIBODY: Hepatitis C Ab: NONREACTIVE

## 2023-07-23 ENCOUNTER — Other Ambulatory Visit: Payer: Self-pay | Admitting: Family Medicine

## 2023-07-23 DIAGNOSIS — R748 Abnormal levels of other serum enzymes: Secondary | ICD-10-CM

## 2023-08-15 ENCOUNTER — Other Ambulatory Visit: Payer: Self-pay

## 2023-08-15 DIAGNOSIS — Z0000 Encounter for general adult medical examination without abnormal findings: Secondary | ICD-10-CM

## 2023-08-18 ENCOUNTER — Encounter: Payer: Self-pay | Admitting: Family Medicine

## 2023-09-15 ENCOUNTER — Encounter: Payer: Self-pay | Admitting: Family Medicine
